# Patient Record
Sex: Female | Born: 1935 | Race: White | Hispanic: No | Smoking: Former smoker
Health system: Southern US, Community
[De-identification: ages and names within clinical notes are randomized; demographics above are authoritative.]

## PROBLEM LIST (undated history)

## (undated) DIAGNOSIS — I10 Essential (primary) hypertension: Secondary | ICD-10-CM

## (undated) DIAGNOSIS — R600 Localized edema: Secondary | ICD-10-CM

## (undated) DIAGNOSIS — IMO0002 Reserved for concepts with insufficient information to code with codable children: Secondary | ICD-10-CM

## (undated) DIAGNOSIS — I480 Paroxysmal atrial fibrillation: Secondary | ICD-10-CM

## (undated) DIAGNOSIS — E785 Hyperlipidemia, unspecified: Secondary | ICD-10-CM

## (undated) DIAGNOSIS — I2510 Atherosclerotic heart disease of native coronary artery without angina pectoris: Secondary | ICD-10-CM

## (undated) DIAGNOSIS — I2699 Other pulmonary embolism without acute cor pulmonale: Secondary | ICD-10-CM

## (undated) HISTORY — PX: REPLACEMENT TOTAL KNEE: SUR1224

## (undated) HISTORY — DX: Paroxysmal atrial fibrillation: I48.0

## (undated) HISTORY — DX: Essential (primary) hypertension: I10

## (undated) HISTORY — DX: Reserved for concepts with insufficient information to code with codable children: IMO0002

## (undated) HISTORY — PX: NASAL SINUS SURGERY: SHX719

## (undated) HISTORY — DX: Other pulmonary embolism without acute cor pulmonale: I26.99

## (undated) HISTORY — DX: Hyperlipidemia, unspecified: E78.5

## (undated) HISTORY — DX: Localized edema: R60.0

## (undated) HISTORY — DX: Atherosclerotic heart disease of native coronary artery without angina pectoris: I25.10

## (undated) HISTORY — PX: BACK SURGERY: SHX140

---

## 1988-07-18 HISTORY — PX: TOTAL HIP ARTHROPLASTY: SHX124

## 1999-03-24 ENCOUNTER — Other Ambulatory Visit: Admission: RE | Admit: 1999-03-24 | Discharge: 1999-03-24 | Payer: Self-pay | Admitting: Obstetrics and Gynecology

## 2000-05-24 ENCOUNTER — Other Ambulatory Visit: Admission: RE | Admit: 2000-05-24 | Discharge: 2000-05-24 | Payer: Self-pay | Admitting: Obstetrics and Gynecology

## 2003-06-10 ENCOUNTER — Ambulatory Visit (HOSPITAL_COMMUNITY): Admission: RE | Admit: 2003-06-10 | Discharge: 2003-06-10 | Payer: Self-pay | Admitting: Orthopedic Surgery

## 2004-01-15 ENCOUNTER — Encounter: Admission: RE | Admit: 2004-01-15 | Discharge: 2004-01-15 | Payer: Self-pay | Admitting: Family Medicine

## 2004-01-23 ENCOUNTER — Encounter: Admission: RE | Admit: 2004-01-23 | Discharge: 2004-01-23 | Payer: Self-pay | Admitting: Family Medicine

## 2004-04-19 ENCOUNTER — Encounter: Payer: Self-pay | Admitting: Orthopedic Surgery

## 2004-05-18 ENCOUNTER — Encounter: Payer: Self-pay | Admitting: Orthopedic Surgery

## 2004-07-23 ENCOUNTER — Encounter: Admission: RE | Admit: 2004-07-23 | Discharge: 2004-07-23 | Payer: Self-pay | Admitting: Family Medicine

## 2004-08-23 ENCOUNTER — Encounter: Payer: Self-pay | Admitting: Internal Medicine

## 2004-09-15 ENCOUNTER — Encounter: Payer: Self-pay | Admitting: Internal Medicine

## 2004-09-23 ENCOUNTER — Emergency Department: Payer: Self-pay | Admitting: Emergency Medicine

## 2004-09-23 ENCOUNTER — Other Ambulatory Visit: Payer: Self-pay

## 2004-10-04 ENCOUNTER — Ambulatory Visit: Payer: Self-pay | Admitting: Family Medicine

## 2004-10-11 ENCOUNTER — Ambulatory Visit: Payer: Self-pay | Admitting: Family Medicine

## 2004-10-18 ENCOUNTER — Ambulatory Visit: Payer: Self-pay | Admitting: Family Medicine

## 2004-10-26 ENCOUNTER — Ambulatory Visit: Payer: Self-pay | Admitting: Family Medicine

## 2004-11-04 ENCOUNTER — Ambulatory Visit: Payer: Self-pay | Admitting: Family Medicine

## 2004-11-10 ENCOUNTER — Ambulatory Visit: Payer: Self-pay | Admitting: Family Medicine

## 2005-03-30 ENCOUNTER — Encounter: Payer: Self-pay | Admitting: Neurosurgery

## 2005-04-01 ENCOUNTER — Encounter: Admission: RE | Admit: 2005-04-01 | Discharge: 2005-04-01 | Payer: Self-pay | Admitting: Family Medicine

## 2005-04-17 ENCOUNTER — Encounter: Payer: Self-pay | Admitting: Neurosurgery

## 2005-05-16 ENCOUNTER — Ambulatory Visit: Payer: Self-pay | Admitting: Internal Medicine

## 2005-05-17 ENCOUNTER — Ambulatory Visit: Payer: Self-pay | Admitting: Cardiology

## 2005-05-18 ENCOUNTER — Encounter: Payer: Self-pay | Admitting: Neurosurgery

## 2005-06-17 ENCOUNTER — Encounter: Payer: Self-pay | Admitting: Neurosurgery

## 2005-07-18 ENCOUNTER — Encounter: Payer: Self-pay | Admitting: Neurosurgery

## 2005-08-02 ENCOUNTER — Ambulatory Visit: Payer: Self-pay | Admitting: Internal Medicine

## 2005-08-18 ENCOUNTER — Encounter: Payer: Self-pay | Admitting: Neurosurgery

## 2005-09-15 ENCOUNTER — Encounter: Payer: Self-pay | Admitting: Neurosurgery

## 2005-09-22 ENCOUNTER — Ambulatory Visit: Payer: Self-pay | Admitting: Family Medicine

## 2005-09-26 ENCOUNTER — Ambulatory Visit: Payer: Self-pay | Admitting: Ophthalmology

## 2005-10-03 ENCOUNTER — Ambulatory Visit: Payer: Self-pay | Admitting: Ophthalmology

## 2005-10-16 ENCOUNTER — Encounter: Payer: Self-pay | Admitting: Neurosurgery

## 2005-11-15 ENCOUNTER — Encounter: Admission: RE | Admit: 2005-11-15 | Discharge: 2005-11-15 | Payer: Self-pay | Admitting: Family Medicine

## 2005-11-15 ENCOUNTER — Encounter: Payer: Self-pay | Admitting: Neurosurgery

## 2005-12-16 ENCOUNTER — Encounter: Payer: Self-pay | Admitting: Neurosurgery

## 2005-12-20 ENCOUNTER — Ambulatory Visit: Payer: Self-pay | Admitting: Ophthalmology

## 2005-12-26 ENCOUNTER — Ambulatory Visit: Payer: Self-pay | Admitting: Ophthalmology

## 2006-01-25 ENCOUNTER — Encounter: Payer: Self-pay | Admitting: Orthopedic Surgery

## 2006-02-15 ENCOUNTER — Encounter: Payer: Self-pay | Admitting: Orthopedic Surgery

## 2006-03-18 ENCOUNTER — Encounter: Payer: Self-pay | Admitting: Orthopedic Surgery

## 2006-03-22 ENCOUNTER — Other Ambulatory Visit: Payer: Self-pay

## 2006-03-22 ENCOUNTER — Emergency Department: Payer: Self-pay | Admitting: Emergency Medicine

## 2006-04-10 ENCOUNTER — Ambulatory Visit: Payer: Self-pay | Admitting: Cardiology

## 2006-04-18 ENCOUNTER — Ambulatory Visit: Payer: Self-pay

## 2006-04-18 ENCOUNTER — Ambulatory Visit: Payer: Self-pay | Admitting: Cardiology

## 2006-04-18 ENCOUNTER — Encounter: Payer: Self-pay | Admitting: Cardiovascular Disease

## 2006-04-28 ENCOUNTER — Inpatient Hospital Stay (HOSPITAL_BASED_OUTPATIENT_CLINIC_OR_DEPARTMENT_OTHER): Admission: RE | Admit: 2006-04-28 | Discharge: 2006-04-28 | Payer: Self-pay | Admitting: Cardiology

## 2006-04-28 ENCOUNTER — Ambulatory Visit: Payer: Self-pay | Admitting: Cardiology

## 2006-05-12 ENCOUNTER — Ambulatory Visit: Payer: Self-pay | Admitting: Cardiology

## 2006-05-30 ENCOUNTER — Encounter: Admission: RE | Admit: 2006-05-30 | Discharge: 2006-05-30 | Payer: Self-pay | Admitting: Family Medicine

## 2006-06-30 ENCOUNTER — Ambulatory Visit: Payer: Self-pay

## 2006-06-30 ENCOUNTER — Ambulatory Visit: Payer: Self-pay | Admitting: Cardiology

## 2006-07-31 ENCOUNTER — Ambulatory Visit: Payer: Self-pay | Admitting: Cardiology

## 2006-08-23 ENCOUNTER — Inpatient Hospital Stay (HOSPITAL_COMMUNITY): Admission: RE | Admit: 2006-08-23 | Discharge: 2006-08-29 | Payer: Self-pay | Admitting: Orthopedic Surgery

## 2006-09-20 ENCOUNTER — Encounter: Payer: Self-pay | Admitting: Orthopedic Surgery

## 2006-09-28 ENCOUNTER — Ambulatory Visit: Payer: Self-pay | Admitting: Cardiology

## 2006-10-17 ENCOUNTER — Encounter: Payer: Self-pay | Admitting: Orthopedic Surgery

## 2006-11-16 ENCOUNTER — Encounter: Payer: Self-pay | Admitting: Orthopedic Surgery

## 2007-02-01 ENCOUNTER — Encounter: Payer: Self-pay | Admitting: Orthopedic Surgery

## 2007-02-02 ENCOUNTER — Ambulatory Visit: Payer: Self-pay | Admitting: Specialist

## 2007-03-03 IMAGING — US ABDOMEN ULTRASOUND
1 series · 14 of 25 positions shown · non-contrast
Comparison: none

REASON FOR EXAM: Evaluate cholecystitis                       RM2
COMMENTS:

[Series 1: abdomen ultrasound · 0.35mm/px · 14 of 50 slices shown]
[im 1/50]
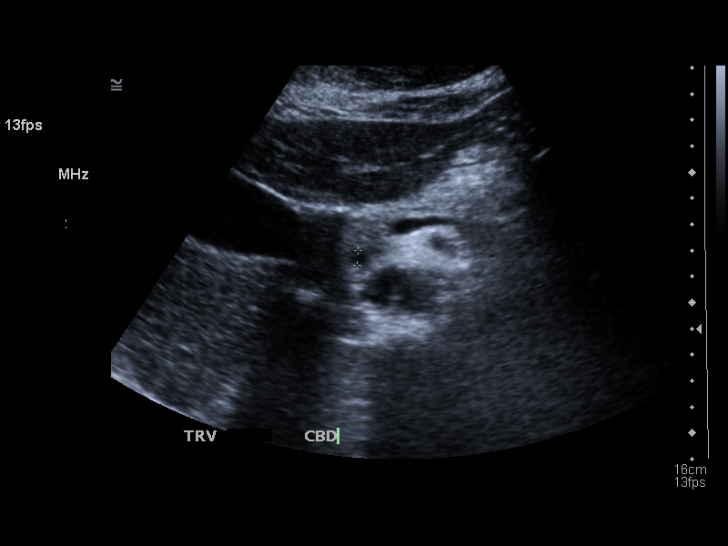
[im 5/50]
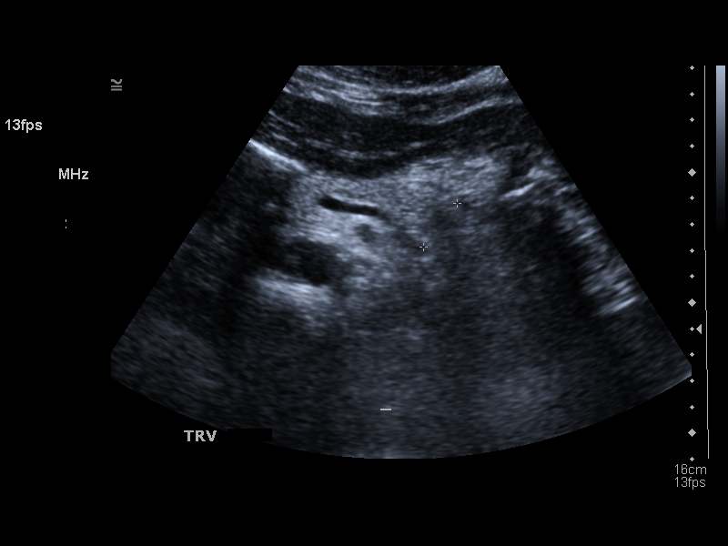
[im 9/50]
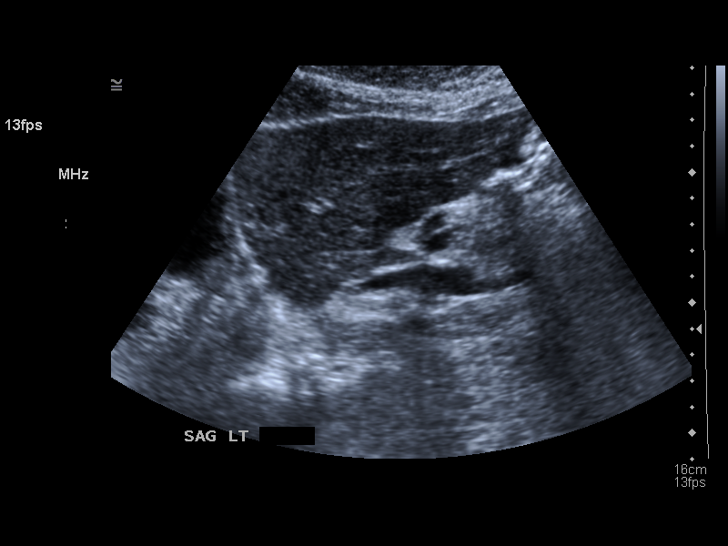
[im 13/50]
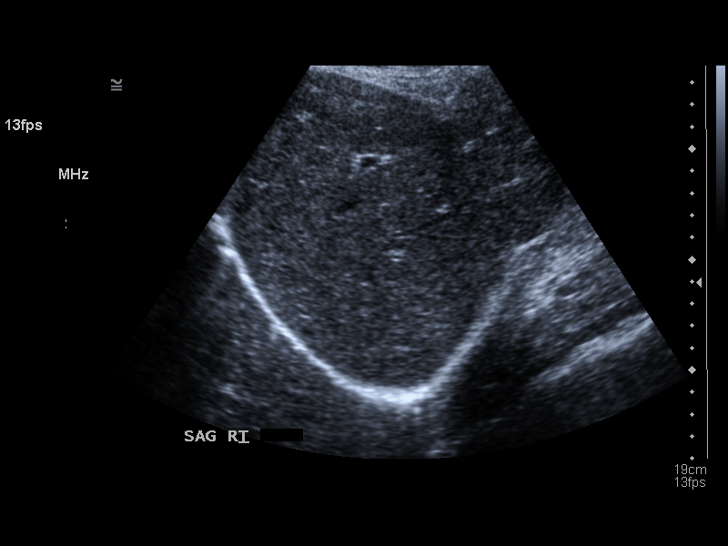
[im 17/50]
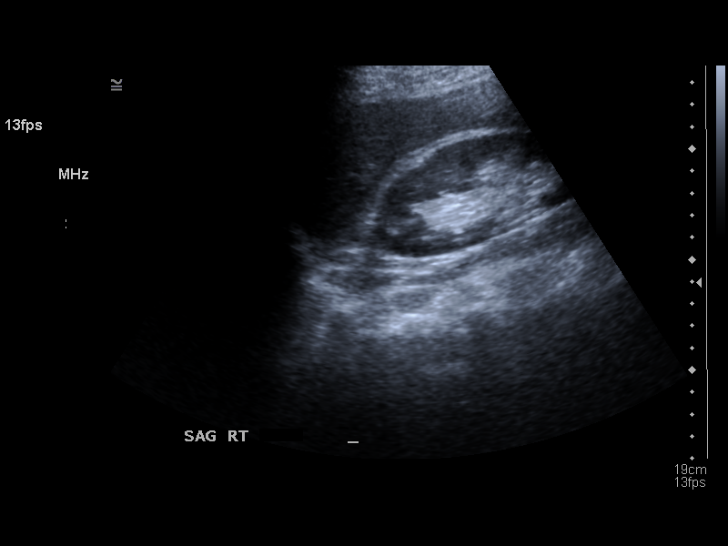
[im 19/50]
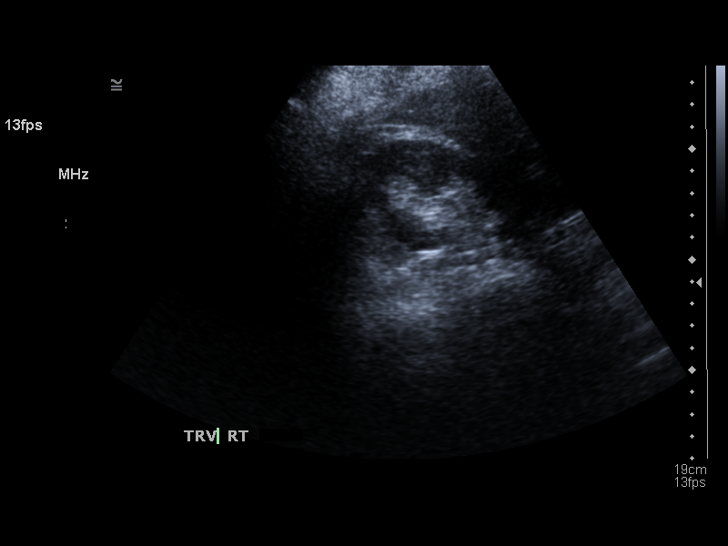
[im 23/50]
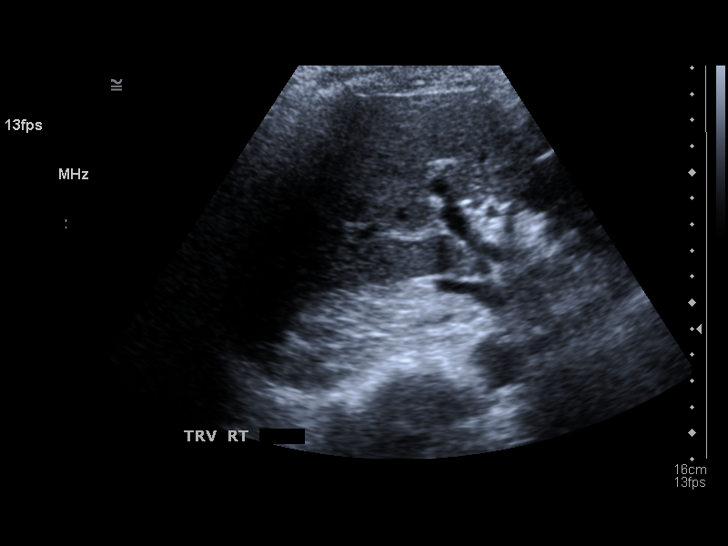
[im 27/50]
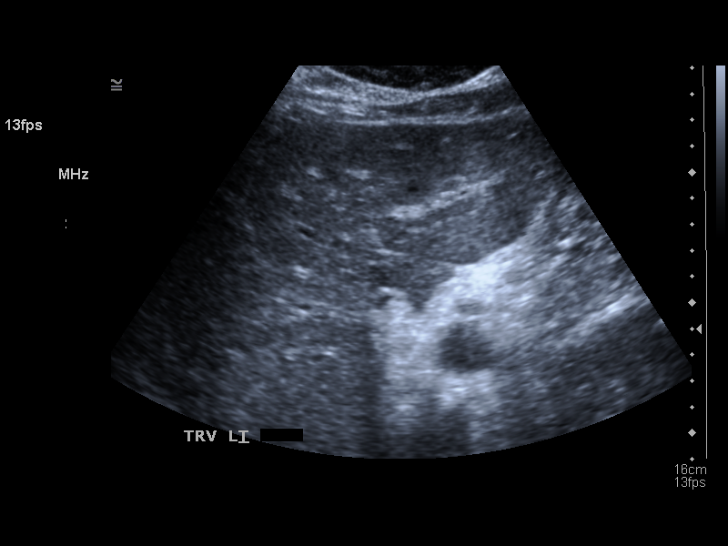
[im 31/50]
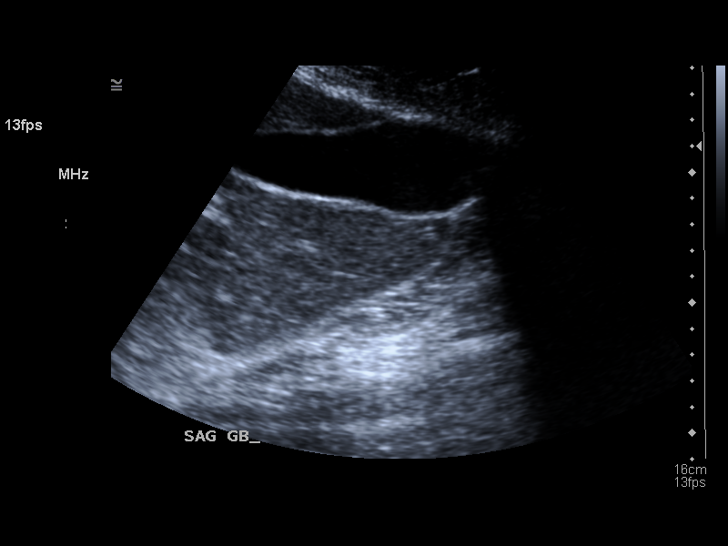
[im 33/50]
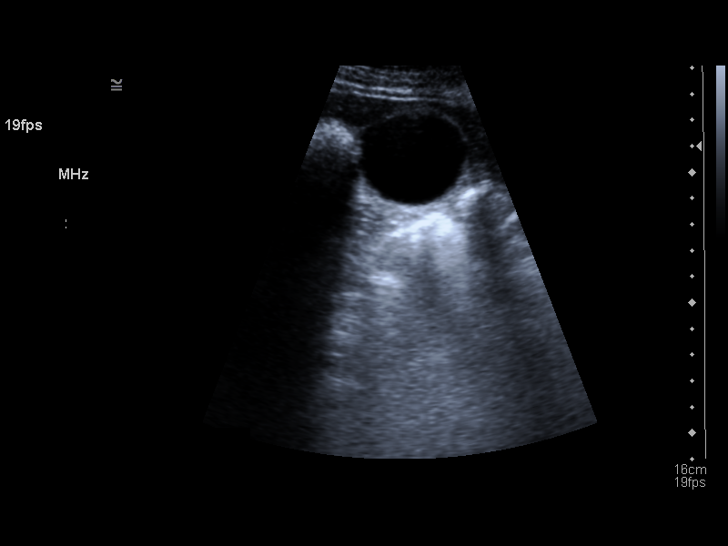
[im 37/50]
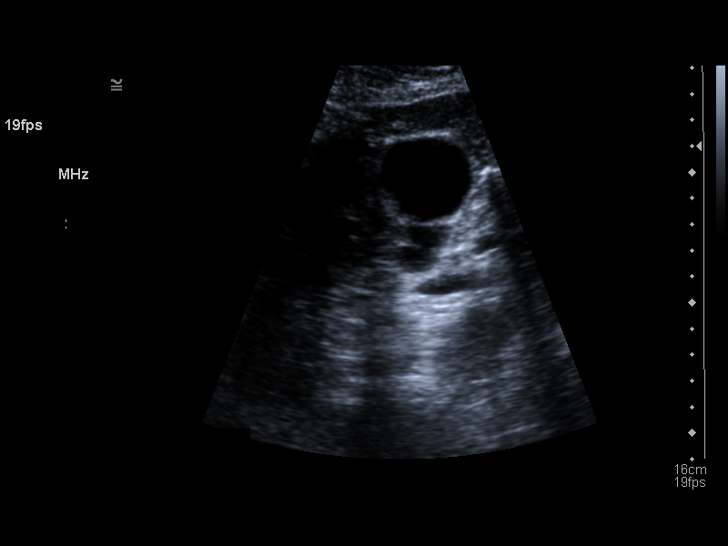
[im 41/50]
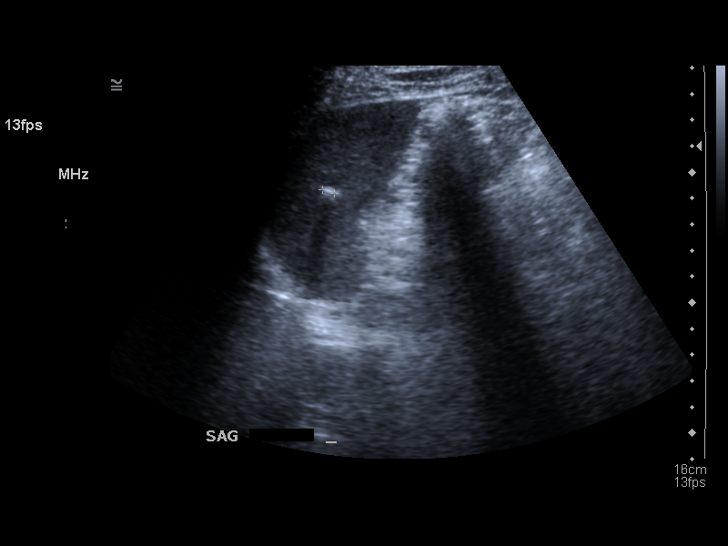
[im 45/50]
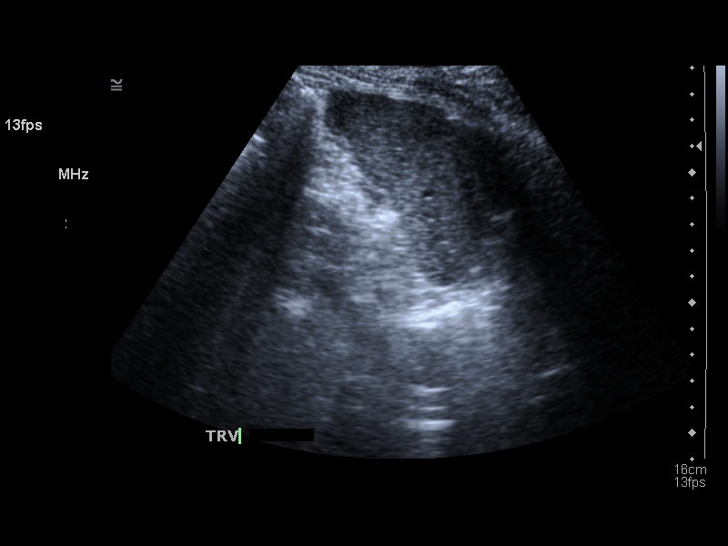
[im 50/50]
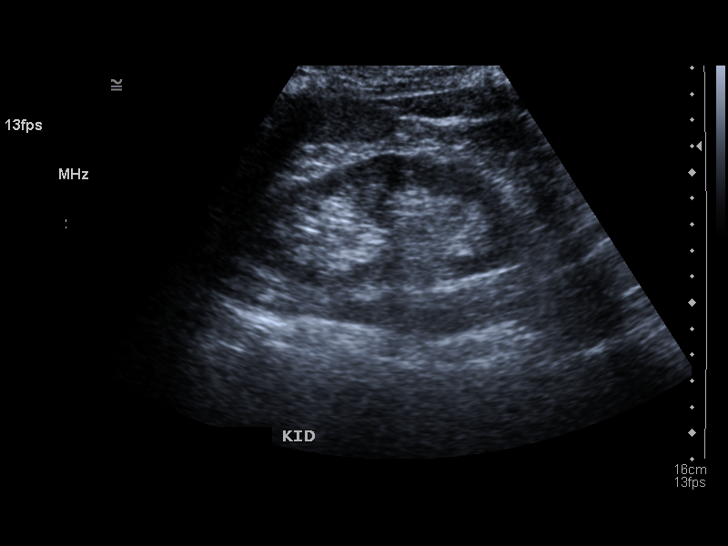

[14 of 25 positions shown; findings below may reference images not displayed]

PROCEDURE:     US  - US ABDOMEN GENERAL SURVEY  - September 23, 2004  [DATE]

RESULT:     The liver exhibits normal echotexture with no mass or ductal
dilation. The gallbladder is adequately distended with no evidence of
stones, wall thickening or pericholecystic fluid. The common bile duct is
normal measuring 5.6 mm in diameter. The pancreas exhibits no focal mass or
ductal dilation. The spleen is normal in size but there is a calcification
with distal shadowing present consistent with prior granulomatous infection.
The kidneys are normal in echotexture and size with no evidence of
obstruction.
IMPRESSION: I see no acute intra-abdominal abnormality. Specifically, I
see no abnormality related to the liver or gallbladder.

## 2007-08-01 ENCOUNTER — Ambulatory Visit: Payer: Self-pay | Admitting: Internal Medicine

## 2007-08-15 ENCOUNTER — Ambulatory Visit: Payer: Self-pay | Admitting: Internal Medicine

## 2007-08-15 ENCOUNTER — Encounter: Payer: Self-pay | Admitting: Internal Medicine

## 2008-01-16 ENCOUNTER — Ambulatory Visit (HOSPITAL_COMMUNITY): Admission: RE | Admit: 2008-01-16 | Discharge: 2008-01-16 | Payer: Self-pay | Admitting: Neurological Surgery

## 2009-02-02 ENCOUNTER — Ambulatory Visit: Payer: Self-pay | Admitting: Cardiovascular Disease

## 2009-02-02 DIAGNOSIS — R609 Edema, unspecified: Secondary | ICD-10-CM

## 2009-02-02 DIAGNOSIS — I4891 Unspecified atrial fibrillation: Secondary | ICD-10-CM

## 2009-02-02 DIAGNOSIS — I10 Essential (primary) hypertension: Secondary | ICD-10-CM

## 2009-02-02 DIAGNOSIS — I2510 Atherosclerotic heart disease of native coronary artery without angina pectoris: Secondary | ICD-10-CM

## 2009-02-11 LAB — CONVERTED CEMR LAB
BUN: 23 mg/dL (ref 6–23)
CO2: 27 meq/L (ref 19–32)
Calcium: 9.6 mg/dL (ref 8.4–10.5)
Chloride: 104 meq/L (ref 96–112)
Creatinine, Ser: 0.69 mg/dL (ref 0.40–1.20)
Glucose, Bld: 84 mg/dL (ref 70–99)
HCT: 38.4 % (ref 36.0–46.0)
Hemoglobin: 11.7 g/dL — ABNORMAL LOW (ref 12.0–15.0)
MCHC: 30.5 g/dL (ref 30.0–36.0)
MCV: 97.5 fL (ref 78.0–100.0)
Platelets: 235 10*3/uL (ref 150–400)
Potassium: 4.3 meq/L (ref 3.5–5.3)
RBC: 3.94 M/uL (ref 3.87–5.11)
RDW: 14.7 % (ref 11.5–15.5)
Sodium: 142 meq/L (ref 135–145)
TSH: 1.97 microintl units/mL (ref 0.350–4.500)
WBC: 5.1 10*3/uL (ref 4.0–10.5)

## 2009-03-31 ENCOUNTER — Ambulatory Visit: Payer: Self-pay

## 2009-03-31 ENCOUNTER — Encounter: Payer: Self-pay | Admitting: Cardiovascular Disease

## 2009-04-01 ENCOUNTER — Ambulatory Visit: Payer: Self-pay | Admitting: Cardiovascular Disease

## 2009-04-27 ENCOUNTER — Telehealth (INDEPENDENT_AMBULATORY_CARE_PROVIDER_SITE_OTHER): Payer: Self-pay | Admitting: *Deleted

## 2009-05-06 ENCOUNTER — Telehealth (INDEPENDENT_AMBULATORY_CARE_PROVIDER_SITE_OTHER): Payer: Self-pay | Admitting: *Deleted

## 2009-10-05 ENCOUNTER — Telehealth: Payer: Self-pay | Admitting: Cardiovascular Disease

## 2009-10-07 ENCOUNTER — Encounter: Payer: Self-pay | Admitting: Cardiovascular Disease

## 2009-10-08 ENCOUNTER — Ambulatory Visit: Payer: Self-pay

## 2009-10-08 ENCOUNTER — Encounter: Payer: Self-pay | Admitting: Cardiovascular Disease

## 2009-10-13 ENCOUNTER — Ambulatory Visit: Payer: Self-pay | Admitting: Cardiovascular Disease

## 2009-10-13 DIAGNOSIS — R079 Chest pain, unspecified: Secondary | ICD-10-CM

## 2010-05-17 ENCOUNTER — Encounter: Payer: Self-pay | Admitting: Cardiovascular Disease

## 2010-05-17 ENCOUNTER — Emergency Department: Payer: Self-pay | Admitting: Emergency Medicine

## 2010-05-23 ENCOUNTER — Encounter: Payer: Self-pay | Admitting: Cardiovascular Disease

## 2010-05-23 ENCOUNTER — Inpatient Hospital Stay: Payer: Self-pay | Admitting: Internal Medicine

## 2010-05-24 ENCOUNTER — Encounter: Payer: Self-pay | Admitting: Cardiovascular Disease

## 2010-06-25 ENCOUNTER — Ambulatory Visit: Payer: Self-pay | Admitting: Cardiovascular Disease

## 2010-07-15 ENCOUNTER — Ambulatory Visit: Payer: Self-pay | Admitting: Family

## 2010-08-08 ENCOUNTER — Encounter: Payer: Self-pay | Admitting: Family Medicine

## 2010-08-08 ENCOUNTER — Encounter: Payer: Self-pay | Admitting: Neurological Surgery

## 2010-08-17 NOTE — Progress Notes (Signed)
Summary: set up doppler   Phone Note Call from Patient Call back at Home Phone [phone] Call back at home after 4 p.m. today.    Caller: Patient  Reason for Call: Talk to Nurse Details for Reason: Per pt calling to set up doppler. pt hubsand was sick the past - why she had to cancel appt,  schedule is not until april 12. wanted sooner is [place].  Initial call taken by: Lorne Skeens,  October 05, 2009 1:04 PM  Follow-up for Phone Call        Pt. needs lower extremity venous dopplers. Have tried to reach pt 5 times since 4 PM today but phone is busy. Will continue to try to reach her. Dossie Arbour, RN, BSN  October 05, 2009 4:43 PM Spoke with pt. She will come to [facility] office on October 08, 2009 at 1:00 for dopplers. Follow-up by: Dossie Arbour, RN, BSN,  October 05, 2009 5:00 PM

## 2010-08-17 NOTE — Miscellaneous (Signed)
Summary: Orders Update  Clinical Lists Changes  Orders: Added new Test order of Venous Duplex Lower Extremity (Venous Duplex Lower) - Signed 

## 2010-08-17 NOTE — Assessment & Plan Note (Signed)
Summary: ROV/AMD  Medications Added FUROSEMIDE 40 MG TABS (FUROSEMIDE) Take one tablet by mouth twice a week - not taking POTASSIUM CHLORIDE CRYS CR 20 MEQ CR-TABS (POTASSIUM CHLORIDE CRYS CR) Take one tablet by mouth twice a week - not taking ZOLOFT 50 MG TABS (SERTRALINE HCL) 1 by mouth once daily - not sure of dose LISINOPRIL 10 MG TABS (LISINOPRIL) Take one tablet by mouth daily NITROSTAT 0.4 MG SUBL (NITROGLYCERIN) 1 tablet under tongue at onset of chest pain; you may repeat every 5 minutes for up to 3 doses.      Allergies Added:   Primary Provider:  Jerl Mina, MD   History of Present Illness:  77 yo WF with history of mild non-obstructive CAD, HTN, Hyperlipidemia, paroxysmal atrial fibrillation, PE, asthma and chronic venous insufficiency who presents for follow up.   She has been caring for her sick husband and has been under much stress. She c/o chronic bilateral lower ext edema, slightly worse on the right than left.   She has had chronic back pain for many years and along with lower extremity joint pain, this keeps her inactive. She has had no exertional chest pain.  she cannot wear compression stockings because she cannot get them on. She refuses to take Lasix daily because it makes her run to the bathroom. She has not been elevating her feet during the day.   She had a new episode of chest pain three days ago after she ate dinner with her husband. She went home and her symptoms resolved without intervention. She has not had any other epsiodes since that time. She had previous episode 20 years ago.     Current Problems (verified): 1)  Essential Hypertension, Benign  (ICD-401.1) 2)  Cad, Native Vessel  (ICD-414.01) 3)  Edema  (ICD-782.3) 4)  Coronary Atherosclerosis Native Coronary Artery  (ICD-414.01) 5)  Atrial Fibrillation  (ICD-427.31)  Current Medications (verified): 1)  Atenolol 25 Mg Tabs (Atenolol) .... Take One Tablet By Mouth Two Times A Day 2)   Simvastatin 40 Mg Tabs (Simvastatin) .... Take One Tablet By Mouth Daily At Bedtime 3)  Warfarin Sodium 5 Mg Tabs (Warfarin Sodium) .... Use As Directed By Anticoagulation Clinic 4)  Amlodipine Besylate 5 Mg Tabs (Amlodipine Besylate) .... Take One Tablet By Mouth Daily 5)  Furosemide 40 Mg Tabs (Furosemide) .... Take One Tablet By Mouth Twice A Week - Not Taking 6)  Potassium Chloride Crys Cr 20 Meq Cr-Tabs (Potassium Chloride Crys Cr) .... Take One Tablet By Mouth Twice A Week - Not Taking 7)  Zoloft 50 Mg Tabs (Sertraline Hcl) .Marland Kitchen.. 1 By Mouth Once Daily - Not Sure of Dose  Allergies (verified): 1)  ! Levaquin 2)  ! * Voltram 3)  ! Sulfa 4)  ! Morphine 5)  ! Oxycodone Hcl 6)  ! Darvocet 7)  ! * Robaxin 8)  ! * Fentanyl 9)  ! * Shellfish  Past History:  Past Medical History: Last updated: 04/01/2009 CAD-mild nonobstructive by cath HTN Hyperlipidemia Pulmonary embolism Paroxysmal atrial fibrillation Asthma Degenerative disc disease Lower extremity edema-chronic  Past Surgical History: Last updated: 27-Feb-2009 Back surgeries x 4 Left Hip replacement 1990 Right knee replacement Sinus surgery  Family History: Last updated: 02-27-2009 Mother deceased age 49 Alzheimers Father deceased [place] Strong family history of CAD  Social History: Last updated: February 27, 2009 No tobacco, remote history, quit 1976 No alcohol No illicit drugs Married Husband has Parkinsons 2 children  Review of Systems  The patient complains of chest pain, dyspnea on exertion, and peripheral edema.  The patient denies fever, weight loss, weight gain, vision loss, decreased hearing, hoarseness, syncope, prolonged cough, abdominal pain, incontinence, muscle weakness, depression, and enlarged lymph nodes.    Vital Signs:  Patient profile:   77 year old female Height:      64 inches Weight:      201.75 pounds BMI:     34.76 Pulse rate:   63 / minute Pulse rhythm:   regular BP sitting:   144 /  81  (left arm) Cuff size:   large  Vitals Entered By: Mercer Pod (October 13, 2009 4:19 PM)    EKG  Procedure date:  10/13/2009  Findings:      Normal Sinus rhythm with rate of 63 bpm, RBBB, LAFB  Impression & Recommendations:  Problem # 1:  CHEST PAIN-UNSPECIFIED (ICD-786.50) Recent signs concerning for atypical chest pain, possibly GERD or indigestion. I have asked her to contact us Me if she continues to have further episodes of chest pain.  Her updated medication list for this problem includes:    Atenolol 25 Mg Tabs (Atenolol) .Marland Kitchen... Take one tablet by mouth two times a day    Warfarin Sodium 5 Mg Tabs (Warfarin sodium) ..... Use as directed by anticoagulation clinic    Amlodipine Besylate 5 Mg Tabs (Amlodipine besylate) .Marland Kitchen... Take one tablet by mouth daily    Lisinopril 10 Mg Tabs (Lisinopril) .Marland Kitchen... Take one tablet by mouth daily    Nitrostat 0.4 Mg Subl (Nitroglycerin) .Marland Kitchen... 1 tablet under tongue at onset of chest pain; you may repeat every 5 minutes for up to 3 doses.  Problem # 2:  EDEMA (ICD-782.3)  She has chronic venous stasis. Echocardiogram shows no elevated right ventricular systolic pressures. She's unable to place TED hose. We will hold her amlodipine and sometimes this can make venous insufficiency worse and start her on lisinopril 10 mg daily.  Problem # 3:  CAD, NATIVE VESSEL (ICD-414.01) mild nonobstructive disease based on cardiac catheterization in 2007. We'll continue her on simvastatin. She is not on aspirin as she is on warfarin. She is a nonsmoker.  Her updated medication list for this problem includes:    Atenolol 25 Mg Tabs (Atenolol) .Marland Kitchen... Take one tablet by mouth two times a day    Warfarin Sodium 5 Mg Tabs (Warfarin sodium) ..... Use as directed by anticoagulation clinic    Amlodipine Besylate 5 Mg Tabs (Amlodipine besylate) .Marland Kitchen... Take one tablet by mouth daily    Lisinopril 10 Mg Tabs (Lisinopril) .Marland Kitchen... Take one tablet by mouth daily     Nitrostat 0.4 Mg Subl (Nitroglycerin) .Marland Kitchen... 1 tablet under tongue at onset of chest pain; you may repeat every 5 minutes for up to 3 doses. Prescriptions: NITROSTAT 0.4 MG SUBL (NITROGLYCERIN) 1 tablet under tongue at onset of chest pain; you may repeat every 5 minutes for up to 3 doses.  #25 x 3   Entered by:   Charlena Cross, RN, BSN   Authorized by:   Dossie Arbour MD   Signed by:   Charlena Cross, RN, BSN on 10/13/2009   Method used:   Electronically to        [facility]* (retail)       [address]       [place], [place]  [address]       Ph: [phone]       Fax: [phone]  RxID:   [id number] LISINOPRIL 10 MG TABS (LISINOPRIL) Take one tablet by mouth daily  #30 x 3   Entered by:   Charlena Cross, RN, BSN   Authorized by:   Dossie Arbour MD   Signed by:   Charlena Cross, RN, BSN on 10/13/2009   Method used:   Electronically to        [facility]* (retail)       [address]       [place], [place]  [address]       Ph: [phone]       Fax: [phone]   RxID:   [id number]   Appended Document: ROV/AMD physical examination: Well-appearing elderly woman in no apparent distress, HEENT exam is benign, oropharynx is clear, neck is supple with no JVP or carotid bruits, heart sounds are regular with S1-S2 and no murmurs appreciated, lungs are clear to auscultation with no wheezes or rales, abdominal exam is benign, no significant tenderness on palpation. She does have trace to 1+ lower extremity edema bilaterally to below the knees, pulses are equal and symmetrical in her upper and lower extremities. Neurologic exam is nonfocal, skin is warm and dry

## 2010-10-12 ENCOUNTER — Encounter: Payer: Self-pay | Admitting: Family Medicine

## 2010-10-17 ENCOUNTER — Encounter: Payer: Self-pay | Admitting: Family Medicine

## 2010-10-21 ENCOUNTER — Telehealth: Payer: Self-pay | Admitting: *Deleted

## 2010-10-21 NOTE — Telephone Encounter (Signed)
Pt called this am c/o incr RLE edema and pain recently and is concerned could be a blood clot. Pt does not have pain at this time, however, RLE is more swollen than left. Pt does state this is not really unusual for her. Pt has h/o LE neruopathy and edema. Per last office note, we had tried Lasix 40mg  2x weekly in the past, but due to pt's neuropathy (bladder) pt unable to tolerate urinary frequency. Pt also has h/o of a fib and PE, pt is currently on Coumadin 3mg  qd except 4.5mg  on Wed. This is followed at pcp (Dr. Burnett Sheng). Pt does not know what her last INR was, states "if they want me to change my dose they call me after lab results come back." Pt is not very satisfied with coumadin visits, she would rather have POC testing in office and know results same day. I have requested last lab/office note from Dr. Webb Silversmith and set pt up for coumadin visit next wed to establish here in [place]. But pt does think her INR's have been stable though.  Pt does state she was on her feet more than usual yesterday. Advised pt to take Lasix 40mg  today only and see if this helps with swelling, and to call office to let us know of status. Notified her to call our office if any c/o incr pain prior to appt. Otherwise, pt will f/u with Dr. Mariah Milling and have coumadin visit next [facility] 10/27/10. Pt ok with this.

## 2010-10-27 ENCOUNTER — Ambulatory Visit: Payer: Self-pay | Admitting: Cardiovascular Disease

## 2010-10-27 ENCOUNTER — Ambulatory Visit (INDEPENDENT_AMBULATORY_CARE_PROVIDER_SITE_OTHER): Payer: Medicare Other | Admitting: Emergency Medicine

## 2010-10-27 ENCOUNTER — Encounter: Payer: Self-pay | Admitting: Cardiovascular Disease

## 2010-10-27 ENCOUNTER — Ambulatory Visit (INDEPENDENT_AMBULATORY_CARE_PROVIDER_SITE_OTHER): Payer: Medicare Other | Admitting: Cardiovascular Disease

## 2010-10-27 DIAGNOSIS — I4891 Unspecified atrial fibrillation: Secondary | ICD-10-CM

## 2010-10-27 DIAGNOSIS — I2510 Atherosclerotic heart disease of native coronary artery without angina pectoris: Secondary | ICD-10-CM

## 2010-10-27 DIAGNOSIS — R609 Edema, unspecified: Secondary | ICD-10-CM

## 2010-10-27 DIAGNOSIS — R0600 Dyspnea, unspecified: Secondary | ICD-10-CM | POA: Insufficient documentation

## 2010-10-27 DIAGNOSIS — Z7901 Long term (current) use of anticoagulants: Secondary | ICD-10-CM | POA: Insufficient documentation

## 2010-10-27 DIAGNOSIS — I10 Essential (primary) hypertension: Secondary | ICD-10-CM

## 2010-10-27 DIAGNOSIS — R0989 Other specified symptoms and signs involving the circulatory and respiratory systems: Secondary | ICD-10-CM

## 2010-10-27 LAB — POCT INR: INR: 1.3

## 2010-10-27 NOTE — Patient Instructions (Signed)
You are doing well. No medication changes were made. Please take lasix as needed for worsening swelling or for increasing shortness of breath. Please call us if you have new issues that need to be addressed before your next appt.  We will call you for a follow up Appt.

## 2010-10-27 NOTE — Assessment & Plan Note (Signed)
Her edema is likely secondary to venous insufficiency, worse on the right side from her history of right knee surgery. We have suggested that she try TED hose, leg elevation. She is unable to get a TED hose on herself. She can also try diuretic on an occasional basis if the swelling is severe.

## 2010-10-27 NOTE — Assessment & Plan Note (Signed)
We have suggested she continue her statin. Those symptoms concerning for angina at this time. No further workup.

## 2010-10-27 NOTE — Assessment & Plan Note (Signed)
Blood pressure is well-controlled on her current medication regimen. No changes made. 

## 2010-10-27 NOTE — Progress Notes (Signed)
   Patient ID: [name], female    DOB: 25-May-1929, 77 y.o.   MRN: [id number]  HPI Comments: 77 yo WF with history of mild non-obstructive CAD, HTN, Hyperlipidemia, paroxysmal atrial fibrillation, PE, asthma and chronic venous insufficiency who presents for follow up.   Her husband passed away in 08-17-22 of this year.  She had a hospitalization in November of last year for shortness of breath felt secondary to bronchitis and diastolic dysfunction. Echocardiogram showed normal function with mild MR, diastolic dysfunction, normal right ventricular systolic pressures.  She reports having significant problems with her legs. She has swelling worse on the right than the left, neuropathy, chronic pain, severe left knee and left hip pain. She has followup with orthopedics.  She has had chronic back pain for many years and along with lower extremity joint pain, this keeps her inactive. She has had no exertional chest pain.   she cannot wear compression stockings because she cannot get them on. She does have shortness of breath with exertion but has not been taking Lasix.   EKG shows normal sinus rhythm with rate 63 beats per minute, right bundle branch block, left anterior fascicular block      Review of Systems  HENT: Negative.   Eyes: Negative.   Respiratory: Positive for shortness of breath.   Cardiovascular: Positive for palpitations and leg swelling.  Gastrointestinal: Negative.   Musculoskeletal: Positive for back pain, joint swelling, arthralgias and gait problem.  Skin: Negative.   Neurological: Negative.   Hematological: Negative.   Psychiatric/Behavioral: Negative.   All other systems reviewed and are negative.   BP 130/62  Pulse 63  Ht 5\' 4"  (1.626 m)  Wt 178 lb (80.74 kg)  BMI 30.55 kg/m2  Physical Exam  Nursing note and vitals reviewed. Constitutional: She is oriented to person, place, and time. She appears well-developed and well-nourished.       Deconditioned, walks  with a walker, slow gait  HENT:  Head: Normocephalic.  Nose: Nose normal.  Mouth/Throat: Oropharynx is clear and moist.  Eyes: Conjunctivae are normal. Pupils are equal, round, and reactive to light.  Neck: Normal range of motion. Neck supple. No JVD present.  Cardiovascular: Normal rate, regular rhythm, normal heart sounds and intact distal pulses.  Exam reveals no gallop and no friction rub.   No murmur heard. Pulmonary/Chest: Effort normal and breath sounds normal. No respiratory distress. She has no wheezes. She has no rales. She exhibits no tenderness.  Abdominal: Soft. Bowel sounds are normal. She exhibits no distension. There is no tenderness.  Musculoskeletal: Normal range of motion. She exhibits edema. She exhibits no tenderness.  Lymphadenopathy:    She has no cervical adenopathy.  Neurological: She is alert and oriented to person, place, and time. Coordination normal.  Skin: Skin is warm and dry. No rash noted. No erythema.  Psychiatric: She has a normal mood and affect. Her behavior is normal. Judgment and thought content normal.         Assessment and Plan

## 2010-10-27 NOTE — Assessment & Plan Note (Signed)
Rhythm today is sinus with no symptoms of tachypalpitations concerning for atrial fibrillation. No further medication changes.

## 2010-10-27 NOTE — Assessment & Plan Note (Signed)
Her shortness of breath is likely multifactorial. She does have diastolic dysfunction, is very deconditioned, mild obesity.  We have suggested that she try Lasix p.r.n. If her breathing gets worse. She does eat frozen foods with high sodium. We have told her to watch her weight and take Lasix as needed for weight increases more than 3 pounds.

## 2010-11-04 ENCOUNTER — Encounter: Payer: Self-pay | Admitting: Cardiovascular Disease

## 2010-11-10 ENCOUNTER — Ambulatory Visit (INDEPENDENT_AMBULATORY_CARE_PROVIDER_SITE_OTHER): Payer: Medicare Other | Admitting: Emergency Medicine

## 2010-11-10 DIAGNOSIS — Z7901 Long term (current) use of anticoagulants: Secondary | ICD-10-CM

## 2010-11-10 DIAGNOSIS — I4891 Unspecified atrial fibrillation: Secondary | ICD-10-CM

## 2010-11-10 LAB — POCT INR: INR: 2.4

## 2010-11-23 ENCOUNTER — Other Ambulatory Visit: Payer: Self-pay | Admitting: Cardiovascular Disease

## 2010-11-24 ENCOUNTER — Other Ambulatory Visit: Payer: Self-pay

## 2010-11-24 MED ORDER — NITROGLYCERIN 0.4 MG SL SUBL: 0.4000 mg | SUBLINGUAL_TABLET | SUBLINGUAL | Status: DC | PRN

## 2010-11-24 NOTE — Telephone Encounter (Signed)
Refill sent in for NTG.

## 2010-11-25 ENCOUNTER — Other Ambulatory Visit (HOSPITAL_COMMUNITY): Payer: Self-pay | Admitting: Orthopedic Surgery

## 2010-11-25 ENCOUNTER — Other Ambulatory Visit: Payer: Self-pay | Admitting: Emergency Medicine

## 2010-11-25 DIAGNOSIS — M25552 Pain in left hip: Secondary | ICD-10-CM

## 2010-11-25 DIAGNOSIS — T84038A Mechanical loosening of other internal prosthetic joint, initial encounter: Secondary | ICD-10-CM

## 2010-11-25 MED ORDER — NITROGLYCERIN 0.4 MG SL SUBL: 0.4000 mg | SUBLINGUAL_TABLET | SUBLINGUAL | Status: AC | PRN

## 2010-11-30 NOTE — Assessment & Plan Note (Signed)
[facility]                         GASTROENTEROLOGY OFFICE NOTE   NAME:[name], [name] CHENETTE                       MRN:          [id number]  DATE:08/01/2007                            DOB:          Feb 04, 1929    PROBLEMS:  Diarrhea and right lower quadrant lower abdominal pain.   HISTORY:  [name] is a pleasant 77 year old white female known to Dr. Lina Sar, who has not been seen by GI for several years.  She had  undergone remote sigmoid colon resection secondary to a stricture, which  was benign.  Her last colonoscopy was in 2003, per Dr. Henrene Hawking.  She has  a somewhat complicated past history, had undergone a spinal fusion  approximately three years ago and has had a difficult course since then  with pain and neuropathy.  She also has a history of hypertension,  hyperlipidemia, asthma, atrial fibrillation.  She had a pulmonary  embolus after surgery in 2006.  Underwent a right knee replacement in  February, 2008.  She reports that she has been having fairly constant  right lower quadrant abdominal pain over the past year.  She says the  pain has been worse over the past month and is generally present daily.  She has new labs that she uses periodically and says that does seem to  help.  Her appetite has  been fair.  She has actually gained weight due  to decrease in her mobility over the past year.  She says that she has  loose, urgent stools post prandially, generally with three bowel  movements per day.  She says she gets a lot of gas and crampy discomfort  because of cramped gas.  She says she is not having any formed stools  over the past couple of months.  She has not noted any melena or  hematochezia.  Has not had any recent antibiotics.  She says that the  urgent stools are affecting her quality of life, as she is afraid to go  out to eat.   CURRENT MEDICATIONS:  1. Atenolol 25 b.i.d.  2. Simvastatin 40 daily.  3. Coumadin as directed.  4.  Celebrex 20 daily.  5. Norvasc 5 mg daily.  6. Prozac 20 daily.  7. Hydrocodone p.r.n.  8. Benadryl p.r.n.   ALLERGIES/INTOLERANCES:  SHELLFISH, LEVAQUIN, DILAUDID, ZINOCEF, SULFA,  MORPHINE, OXYCONTIN, DARVOCET, and ROBAXIN.   PHYSICAL EXAMINATION:  A well-developed, elderly white female in no  acute distress.  Weight is 177, blood pressure 120/60, pulse 64.  CARDIOVASCULAR:  Regular rate and rhythm with S1 and S2.  There is no  murmur, rub or gallop.  PULMONARY:  Clear to A&P.  ABDOMEN:  Large, soft.  Bowel sounds are active.  She is tender in the  right mid quadrant and the right lower quadrant.  There is no guarding  or rebound.  No mass or hepatosplenomegaly.  RECTAL:  Hemoccult negative.  She does have decreased sphincter tone.   IMPRESSION:  A 77 year old female with persistent right lower quadrant  pain and diarrhea.  She carries a diagnosis of irritable bowel syndrome.  The  etiology of her current symptoms is not clear.  Suspect she may have  adhesions and may have increased urgency due to an incompetent anal  sphincter with a history of irritable bowel syndrome, rule out bacterial  overgrowth contributing to her increase in diarrhea and gas recently.   PLAN:  1. Start trial of Imodium 1 p.o. daily.  2. Treat for bacterial overgrowth with Xifaxan 400 mg t.i.d. x10 days      and Align 1 p.o. daily x30 days.  3. Schedule follow-up colonoscopy with Dr. Lina Sar.  Will hold her      Coumadin for five days pre-procedure.   ADDENDUM:  Review of record shows patient had colonoscopy in October,  2001 by a Dr. Dolan Amen at [facility] showing ulcerations at her  anastomotic site from her prior sigmoidectomy.  Biopsies showed acute  and chronic inflammation.  There was a chronic inflammatory infiltration  and some evidence of mild acute cryptitis.  A question of Crohn's was  brought up, so she was placed on a trial of Colazal, and she eventually  discontinued this due to  cost.  She had repeat evaluation in 2002 by Dr.  Henrene Hawking because of complaints of diarrhea.  Upper GI and small bowel  follow-through was done, which was unremarkable.  Colonoscopy was  repeated.  The colon appeared normal.  Biopsies were taken to rule out  microscopic or collagenous colitis.  These did show some changes of  active colitis with scattered granulocytes, lymphocytes, and plasma  cells.  No ulceration.  No crypt abscess formation.  I am not clear that  she was treated with any specific medication at that time.  With the  above history, she obviously needs to be reassessed for underling  inflammatory bowel disease or microscopic colitis.  As such, is  scheduled for follow-up colonoscopy with biopsy.      Mike Gip, PA-C  Electronically Signed      Iva Boop, MD,FACG  Electronically Signed   AE/MedQ  DD: 08/10/2007  DT: 08/11/2007  Job #: [id number]   cc:   Hedwig Morton. Juanda Chance, MD

## 2010-12-01 ENCOUNTER — Encounter (HOSPITAL_COMMUNITY): Payer: Self-pay

## 2010-12-01 ENCOUNTER — Encounter (HOSPITAL_COMMUNITY)
Admission: RE | Admit: 2010-12-01 | Discharge: 2010-12-01 | Disposition: A | Discharge status: Home / Self Care | Payer: Medicare Other | Source: Ambulatory Visit | Attending: Orthopedic Surgery | Admitting: Orthopedic Surgery

## 2010-12-01 DIAGNOSIS — Z96649 Presence of unspecified artificial hip joint: Secondary | ICD-10-CM | POA: Insufficient documentation

## 2010-12-01 DIAGNOSIS — M25559 Pain in unspecified hip: Secondary | ICD-10-CM | POA: Insufficient documentation

## 2010-12-01 DIAGNOSIS — M25552 Pain in left hip: Secondary | ICD-10-CM

## 2010-12-01 DIAGNOSIS — T84038A Mechanical loosening of other internal prosthetic joint, initial encounter: Secondary | ICD-10-CM

## 2010-12-01 MED ORDER — TECHNETIUM TC 99M MEDRONATE IV KIT
25.0000 | PACK | Freq: Once | INTRAVENOUS | Status: AC | PRN
  Administered 2010-12-01: 24 via INTRAVENOUS

## 2010-12-03 NOTE — H&P (Signed)
[name], [name] NO.:  [id number]   MEDICAL RECORD NO.:  [id number]          PATIENT TYPE:  INP   LOCATION:  NA                           FACILITY:  [facility]   PHYSICIAN:  Ollen Gross, M.D.    DATE OF BIRTH:  [birth year]   DATE OF ADMISSION:  08/23/2006  DATE OF DISCHARGE:                              HISTORY & PHYSICAL   Date of office visit, history and physical July 27, 2006.   CHIEF COMPLAINT:  Right knee pain.   HISTORY OF PRESENT ILLNESS:  The patient is a 77 year old female who has  been seen by Dr. Homero Fellers Aluisio for ongoing right knee pain.  She has  known arthritis which has been progressive in nature.  She has been  followed for quite some time now.  She has been treated conservatively  in the past.  Despite conservative measures, she continues to have pain.  She has significant cardiac history and has been seen recently by Dr.  Regino Schultze office.  She has undergone workup and felt that she is stable  to undergo planned surgery.  Risks and benefits have been discussed and  she elected to proceed with surgery.   ALLERGIES:  1. ULTRAM.  2. PERCOCET.  3. CODEINE.  4. SHELLFISH allergy.   CURRENT MEDICATIONS:  Coumadin, Norvasc, atenolol, Celebrex,  Simvastatin, Cymbalta, Senna, Lasix, potassium, Macrodantin.   PAST MEDICAL HISTORY:  1. Bilateral lower extremity edema.  2. Nonobstructive coronary arterial disease.  3. Recent cardiac catheterization October 2007 with a 40% mid-LAD.      Ejection fraction of 60% per echo.  4. Hypertension.  5. Hyperlipidemia.  6. Asthma.  7. Paroxysm atrial fibrillation.  8. History of pulmonary embolism post surgery January 2006.  9. Irritable bowel syndrome.  10.History of urinary tract infections.  11.Urinary incontinence.   PAST SURGICAL HISTORY:  1. Spinal fusions x4.  2. Left total hip replacement 1991.  3. Sinus surgery.   SOCIAL HISTORY:  Married.  Retired Comptroller.  Nonsmoker.  No alcohol.  Two children.   FAMILY HISTORY:  Heart disease and hypertension.   REVIEW OF SYSTEMS:  GENERAL:  No fevers, chills, night sweats.  NEURO:  No seizures, syncope or paralysis.  RESPIRATORY:  A little bit of  shortness of breath on exertion but no shortness of breath at rest.  No  productive cough or hemoptysis.  CARDIOVASCULAR:  Hypertension with some  coronary arterial disease and also paroxysm atrial fibrillation.  No  shortness of breath at rest.  No chest pain recently.  GI:  Irritable  bowel.  No nausea, no vomiting.  No blood or mucus in the stool.  GU:  She does have some urinary frequency and urgency.  No dysuria,  hematuria.  MUSCULOSKELETAL:  Right knee.   PHYSICAL EXAMINATION:  VITAL SIGNS:  Pulse 64, respirations 12, blood  pressure 120/70.  GENERAL:  A 77 year old female well-developed, well-nourished, mildly  anxious.  She is accompanied by her husband.  She is alert, oriented and  very pleasant.  HEENT:  Normocephalic and atraumatic.  Pupils round and reactive.  Oropharynx clear.  EOMs intact.  Noted to wear glasses.  NECK:  Supple.  CHEST:  Clear.  HEART:  Regular rate and rhythm with a grade 2/6 early systolic ejection  murmur best heard over aortic point and slightly over pulmonic point.  ABDOMEN:  Soft.  Bowel sounds present.  RECTAL/BREASTS/GENITALIA:  Not done, not pertinent to present illness.  EXTREMITIES:  Right knee shows a varus malalignment deformity.  No  effusion.  Range of motion 5 to 110.  Marked crepitus.   IMPRESSION:  1. Osteoarthritis right knee.  2. Bilateral lower extremity edema.  3. Nonobstructive coronary arterial disease.  4. Recent cardiac catheterization October 2007 with a 40% mid-left      anterior descending artery.  5. Ejection fraction of 60% per echocardiogram.  6. Hypertension.  7. Hyperlipidemia.  8. Asthma.  9. Paroxysm atrial fibrillation.  10.History of pulmonary embolism post surgery January 2006.  11.Irritable bowel  syndrome.  12.History of urinary tract infections.  13.Urinary incontinence.   PLAN:  The patient will be admitted to [facility] to undergo a  right total knee arthroplasty.  Surgery will be performed by Dr. Ollen Gross.  Her medical physician, Dr. Jerl Mina and her cardiologist  Dr. Everardo Beals. Juanda Chance, will both be notified of the room number and  admission.  Will be consulted if needed for assistance with this patient  throughout the hospital course.      Alexzandrew L. Julien Girt, P.A.      Ollen Gross, M.D.  Electronically Signed    ALP/MEDQ  D:  08/22/2006  T:  08/23/2006  Job:  [id number]   cc:   Ollen Gross, M.D.  Fax: [phone]   Jerl Mina  Fax: [phone]   Bruce R. Juanda Chance, MD, [facility]  [address]  [place], [place] [address]   Patient's chart

## 2010-12-03 NOTE — Assessment & Plan Note (Signed)
[facility]                            CARDIOLOGY OFFICE NOTE   NAME:[name], [name]                       MRN:          [id number]  DATE:09/28/2006                            DOB:          05/27/1929    PRIMARY CARE PHYSICIAN:  Dr. Jerl Mina, [facility].   MEDICAL HISTORY:  [name] is 77 years old and has been evaluated by  [place] for shortness of breath and had a catheterization which showed mild  nonobstructive coronary disease and she had an echocardiogram which  showed good LV function.  She also has paroxysmal atrial fibrillation  and been on Coumadin.  She has done fairly well from the standpoint of  her heart.  In early February she had total knee replacement on the  right side by Dr. Lequita Halt.  She is currently in rehab from that.  Her  recovery from this has been limited partly because she has degenerative  disk disease in her lower extremities with some weakness in her legs  related to this.   She said she has had no recent chest pain, shortness of breath, or  palpitations.  She does have some chronic edema in the lower extremities  which is slightly worse on the right side since her knee replacement.   PAST MEDICAL HISTORY:  Her past medical history is significant for  hypertension, hyperlipidemia, previous history of pulmonary embolism  following surgery in 2006.   CURRENT MEDICATIONS:  Her current medications include atenolol,  simvastatin, morphine, Celebrex, Norvasc, Cymbalta.   EXAMINATION:  VITAL SIGNS:  The blood pressure was 129/65 and pulse 61  and regular.  NECK:  There was no venous distention.  The carotid pulses were full  without bruits.  CHEST:  Clear without rales or rhonchi.  The cardiac rhythm was regular.  No murmurs or gallops.  ABDOMEN:  The abdomen was soft without organomegaly.  EXTREMITIES:  There was 2+ edema in the right lower extremity and 1+  edema in the left lower extremity.   IMPRESSION:  1.  Nonobstructive coronary disease at catheterization October 2007.  2. Good left ventricular function.  3. Paroxysmal atrial fibrillation currently on Coumadin therapy and      rate control medications.  4. Moderate aortic dilatation by echo October 2007.  5. Venous insufficiency lower extremities.  6. Status post recent total knee replacement.  7. 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  8. Hypertension.  9. Hyperlipidemia.  10.History of pulmonary embolism following surgery January 2006.   RECOMMENDATIONS:  I think [name] is doing well from a cardiac  standpoint.  Dr. Burnett Sheng is following her Coumadin.  We will plan to see  her back in followup in a year or sooner if she develops any recurrent  problems.  She indicated that her husband was recently seen in the ED at  [facility] with chest pain and she requested further cardiac  evaluation and we will arrange for him to be seen by one of our  [facility] doctors in the office there.  Bruce Elvera Lennox Juanda Chance, MD, [facility]  Electronically Signed    BRB/MedQ  DD: 09/28/2006  DT: 09/29/2006  Job #: [id number]

## 2010-12-03 NOTE — Op Note (Signed)
NAMESIANNI, [name] NO.:  [id number]   MEDICAL RECORD NO.:  [id number]          PATIENT TYPE:  INP   LOCATION:  X004                         FACILITY:  [facility]   PHYSICIAN:  Ollen Gross, M.D.    DATE OF BIRTH:  1928-11-23   DATE OF PROCEDURE:  08/23/2006  DATE OF DISCHARGE:                               OPERATIVE REPORT   PREOPERATIVE DIAGNOSES:  Osteoarthritis right knee.   POSTOPERATIVE DIAGNOSES:  Osteoarthritis right knee.   PROCEDURE:  Right total knee arthroplasty.   SURGEON:  Dr. Homero Fellers Aluisio.   ASSISTANT:  Avel Peace, PA-C.   ANESTHESIA:  General with postop Marcaine pain pump.   ESTIMATED BLOOD LOSS:  Minimal.   DRAINS:  Hemovac x1.   TOURNIQUET TIME:  41 minutes at 300 mmHg.   COMPLICATIONS:  None.   CONDITION:  Stable to recovery.   BRIEF CLINICAL NOTE:  [name] is a 77 year old female with severe end-  stage arthritis of the right knee, progressively worsening pain and  dysfunction.  She has failed nonoperative management including  injections and presents for total knee arthroplasty.   PROCEDURE IN DETAIL:  After successful administration of general  anesthetic, a tourniquet was placed high on the right thigh and right  lower extremity prepped and draped in the usual sterile fashion.  Extremities wrapped in Esmarch, knee flexed, tourniquet inflated to 300  mmHg.  A midline incision is made with a 10 blade through the  subcutaneous tissue to the level of the extensor mechanism.  A fresh  blade is used to make a medial parapatellar arthrotomy.  The soft tissue  over proximal and medial tibia is subperiosteally elevated to the joint  line with a knife and into the semimembranosus bursa with a Cobb  elevator.  The soft tissue laterally is elevated with attention being  paid to avoiding the patellar tendon on the tibial tubercle. The patella  is subluxed laterally, knee flexed 90 degrees, ACL and PCL removed.  A  drill was used to  create a starting hole in the distal femur and canal  was thoroughly irrigated.  A 5 degree right valgus alignment guide is  placed and referencing off the posterior condyles rotation is marked and  the block pinned to remove 11 mm off the distal femur. We took 11  because of her flexion contracture.  Distal femoral resection is made  with an oscillating saw and the sizing block is placed.  A size 3 is the  most appropriate and the rotations marked off the epicondylar axis.  A  size 3 cutting block is placed and the anterior, posterior and chamfer  cuts made.   The tibia is subluxed forward and the menisci are removed. The  extramedullary tibial alignment guide is placed referencing proximally  at the medial aspect of tibial tubercle and distally along the second  metatarsal axis and tibial crest.  The block is pinned to remove 10 mm  off the nondeficient lateral side.  We had to go down another 2 in order  to get to the base of the medial defect.  Tibial resection is made with  an oscillating saw.  A size 2.5 is the most appropriate tibial component  and the proximal tibia prepared with the modular drill and keel punch  for a size 2.5.  Femoral preparation is completed with the intercondylar  cut for a size 3.   A size 2.5 mobile bearing tibial trial, size 3 posterior stabilized  femoral trial and a 12.5 mm posterior stabilized rotating platform  insert trial ae placed.  With the 12.5, full extension is achieved with  excellent varus and valgus balance throughout full range of motion.  Osteophytes are then removed off the posterior femur.  The patella was  everted and thickness measured to be 22 mm.  Freehand resection taken to  12 mm, a 35 template is placed, lug holes were drilled, trial patella is  placed and it tracks normally.  Osteophytes removed off the posterior  femur with a trial in place.  All trials are removed and the cut bone  surfaces prepared with pulsatile lavage.  The  cement is mixed and once  ready for implantation, the size 2.5 mobile bearing tibial tray, size 3  posterior stabilized femur and 35 patella are cemented into place and  the patella is held with a clamp.  A trial 12.5 insert is placed, knee  held in full extension and all extruded cement removed.  Once the cement  is fully hardened, then the permanent 12.5 mm posterior stabilized  rotating platform insert is placed into the tibial tray.  The wound was  copiously irrigated with saline solution and the extensor mechanism  closed over a Hemovac drain with interrupted #1 PDS.  Flexion against  gravity is 135 degrees.  The tourniquet was released with a total  tourniquet time of 41 minutes.  The subcu is closed with interrupted 2-0  Vicryl,  subcuticular with running 4-0 Monocryl.  The drain is hooked to  suction and the catheter for Marcaine pain pump was placed and the pump  was initiated.  Steri-Strips and a bulky sterile dressing were applied  and she was placed into a knee immobilizer, awakened and transported to  recovery in stable condition.      Ollen Gross, M.D.  Electronically Signed     FA/MEDQ  D:  08/23/2006  T:  08/23/2006  Job:  [id number]

## 2010-12-03 NOTE — Cardiovascular Report (Signed)
NAMEELAYJAH, CHANEY                ACCOUNT NO.:  [id number]   MEDICAL RECORD NO.:  [id number]          PATIENT TYPE:  OIB   LOCATION:  NA                           FACILITY:  [facility]   PHYSICIAN:  Bruce R. Juanda Chance, MD, FACCDATE OF BIRTH:  14-Aug-1928   DATE OF PROCEDURE:  04/28/2006  DATE OF DISCHARGE:                              CARDIAC CATHETERIZATION   CLINICAL HISTORY:  Mrs. Harbach is 77 years old and has significant problems  with degenerative disc disease and degenerative joint disease.  She was  recently seen in the emergency room with an episode of atrial fibrillation  which responded.  She also had chest pain.  She ruled out for myocardial  infarction and was sent home.  Since that time she has had some exertional  heaviness and shortness of breath but has not been able to be very active  due to her degenerative joint and spine disease.  She also has a history of  a pulmonary embolus postoperatively in 2006 and was on Coumadin therapy for  a short period of time.  She was seen in the office by Tereso Newcomer and  myself and we made a decision to evaluate her further with angiography to  try and help explain her symptoms.   PROCEDURE:  The procedure was performed by the femoral using an arterial  sheath and 4-French preformed coronary catheters.  A front wall arterial  puncture was performed and  Omnipaque contrast was used.  The patient  tolerated the procedure well and left the lab in satisfactory condition.   RESULTS:  The left main coronary artery is free of significant disease.   Left anterior descending artery gave rise to two diagonal branch and three  septal perforators.  There was 40% narrowing in the mid LAD.   The circumflex artery gave rise to a ramus branch, an atrial branch, a  marginal branch, and two posterolateral branches.  These vessels were free  of significant disease.   The right coronary artery is a moderate-sized vessel that gave rise to a  conus  branch, a right ventricle branch, posterior descending and a  posterolateral branch.  These vessels were free of significant disease.   The left ventriculogram performed in the RAO projection showed good wall  motion with no areas of hypokinesis.  The estimated ejection fraction was  70%.  There appeared to be LVH.   The aortic pressure was 122/56 with mean of 84 and left ventricular pressure  was 122/25.   CONCLUSION:  Mild nonobstructive coronary artery disease with 40% narrowing  in the mid left anterior descending artery , no significant obstruction in  the circumflex and right coronary arteries, and normal left ventricular  function with probable left ventricular hypertrophy.   RECOMMENDATIONS:  The patient has only mild nonobstructive coronary disease.  She does have elevated filling pressures and apparently has LVH and does  have a history of hypertension.  She may have some element of  diastolic dysfunction contributing to shortness of breath.  I think her  risks for a surgical procedure should be fairly low from  a cardiac  standpoint.  We will plan to see her him back in followup in a week and will  plan to a do 2-D echocardiogram to further evaluate her LVH.           ______________________________  Everardo Beals. Juanda Chance, MD, [facility]     BRB/MEDQ  D:  04/28/2006  T:  04/30/2006  Job:  [id number]   cc:   Tyrone Apple, M.D.  Cardiopulmonary Lab

## 2010-12-03 NOTE — Assessment & Plan Note (Signed)
[facility]                              CARDIOLOGY OFFICE NOTE   NAME:Gasparini, DELCIE RUPPERT                       MRN:          [id number]  DATE:04/10/2006                            DOB:          11-26-1928    HISTORY OF PRESENT ILLNESS:  Ms. Diver is a very pleasant 77 year old  female patient who has a remote cardiac history for insignificant blockage  on a catheterization that she thinks was done in 1998 at [facility]  as well as significant degenerative disc disease status post multiple spinal  surgeries complicated by right lower extremity weakness who presents to the  office today for further evaluation of palpitations and chest discomfort.  The patient thinks she has had a history of chest pain and palpitations over  the last year, but she has had significant problems with her back and right  lower extremity and has not really paid this much attention.  On September  5, she developed tachy palpitations and chest heaviness that she describes  as an elephant sitting on her chest.  This occurred while doing minimal  activity.  Her husband took her directly to the emergency room.  Apparently,  she ruled out for myocardial infarction.  She had an EKG done that did show  atrial fibrillation with rapid ventricular rate with a heart rate in the  130s.  She apparently converted to sinus rhythm while in the emergency room  that night.  Her symptoms she thinks lasted about an hour.  Since that visit  to the emergency room, she has had several episodes of exertional chest  heaviness and shortness of breath.  She is not very active secondary to her  back and lower extremity problems.  However, with this she does note  associated diaphoresis.  There are no radiating symptoms.  She does feel  light headed but there is no syncope.  She denies orthopnea or paroxysmal  nocturnal dyspnea.  She denies pleuritic chest pain or significant pedal  edema.   PAST MEDICAL HISTORY:  Significant for probable minimal non-obstructive  coronary artery disease by catheterization in 1998.  She thinks she was  diagnosed with a myocardial infarction at some point in the past.  This was  by an ER physician who saw her for an unrelated problem.  She has a history  of degenerative disc disease and is status post spinal fusion x4.  After her  last spinal fusion in January 2006, she developed a pulmonary embolism and  remained on Coumadin for several months afterwards.  She has a history of  hypertension, but denies this.  She denies a history of diabetes mellitus.  There is a history of treated hypercholesterolemia.  She has a history of  probable inflammatory bowel disease as well as asthma since childhood.  She  has a history of gallbladder polyp.  There is a history of normocytic  anemia, as well.  She has had a history of sigmoid resection secondary to  stricture, total abdominal hysterectomy and bilateral salpingo-oophorectomy,  left total hip replacement, and left Achilles tendon repair.  She had  meningitis in her college years.  She is status post sinus surgery in 1986  with repeat in 1989 or 1990.  She has a past history of Scarlet fever.   CURRENT MEDICATIONS:  Norvasc 5 mg daily, Clonazepam 1 mg p.r.n., Atenolol  25 mg daily, Effexor 37.5 mg daily, Lyrica 75 mg b.i.d., Simvastatin 40 mg  daily, aspirin 81 mg 2 tablets daily.   ALLERGIES:  SHELLFISH, LEVAQUIN, DILAUDID, ULTRAM, ZINACEF, SULFA, MORPHINE,  OXYCONTIN, DARVOCET, ROBAXIN.   SOCIAL HISTORY:  The patient denies any current tobacco or alcohol abuse.  She quit smoking in 1976.  She lives at [facility] in [place] with her  husband who also has Parkinson's disease.  She is a retired Comptroller.   FAMILY HISTORY:  Significant for coronary artery disease.  Her father died  of a myocardial infarction at age 50.  She has several aunts and cousins who  also have coronary disease.   REVIEW  OF SYMPTOMS:  Please see HPI.  She denies fevers, chills, cough,  melena, hematochezia, hematuria, dysuria.  She has significant right lower  extremity weakness as well as low back pain.  She denies any dysphagia or  odynophagia.  The rest of the review of systems are negative.   PHYSICAL EXAMINATION:  GENERAL:  Well developed, well nourished elderly female in no acute  distress.  VITAL SIGNS:  Blood pressure 117/65, pulse 68, weight 175 pounds.  HEENT:  Head normocephalic, atraumatic.  Eyes:  PERRLA, EOMI, sclerae clear.  NECK:  Without lymphadenopathy. No thyromegaly.  No JVD.  CARDIAC:  Normal S1 and S2, regular rate and rhythm, no murmurs.  LUNGS:  Clear to auscultation bilaterally without wheezes, rhonchi, or  rales.  ABDOMEN:  Soft, nontender.  EXTREMITIES:  Trace to 1+ edema, calves are soft and nontender.  SKIN:  Warm and dry.  NEUROLOGICAL:  She is alert and oriented x3.  Cranial nerves 2-12 are  grossly intact.  She has significant deficits of her right lower extremity  with mild foot drop.  PSYCHIATRIC:  She answers all questions appropriately and seems to be  euthyroid.   IMPRESSION:  1. Chest pain and dyspnea concerning for unstable angina pectoris.  2. Paroxysmal atrial fibrillation with rapid ventricular rate.  3. Questionable history of minimal non-obstructive coronary artery disease      by catheterization in 1998.  4. Pulmonary embolism postoperatively January 2006.      a.     Previous Coumadin therapy.  5. Degenerative disc disease status post multiple spinal surgeries with      resultant right lower extremity weakness.  6. Hypertension.  7. Hyperlipidemia.  8. Irritable bowel disease.  9. Asthma since childhood.  10.Status post multiple surgeries as noted above.  11.Shellfish allergy.   PLAN:  The patient was also examined by Dr. Juanda Chance.  She recently did have a  Dobutamine Myoview at clinic.  This showed no ischemia.  However, given her continued  symptoms that sound significant and her risk factors, we think it  is best to proceed with cardiac catheterization to define her coronary  anatomy.  We will set her up in the CV lab next week.  We will pre-medicate  her for her shellfish allergy.  We will also increase her Atenolol to 25 mg  b.i.d.  We will check a TSH with her labs for catheterization.  We will also  check an echocardiogram.  We will make further recommendations regarding her  upcoming knee surgery after  the results of catheterization are known.  We  will also likely initiate Coumadin post catheterization given her history of  paroxysmal atrial fibrillation and significant risk factors.                                  Tereso Newcomer, PA-C                            Bruce R. Juanda Chance, MD, [facility]   SW/MedQ  DD:  04/10/2006  DT:  04/12/2006  Job #:  [id number]   cc:   Jerl Mina, M.D.  Hedwig Morton. Juanda Chance, MD  Donette Larry, M.D., [facility]  Ollen Gross, M.D.  Claude Manges. Cleophas Dunker, M.D.

## 2010-12-03 NOTE — Assessment & Plan Note (Signed)
[facility]                            CARDIOLOGY OFFICE NOTE   NAME:[name], [name]                       MRN:          [id number]  DATE:07/31/2006                            DOB:          06-Jan-1929    CARDIOLOGIST:  Dr. Charlies Constable.   PRIMARY CARE PHYSICIAN:  Dr. Jerl Mina at the [facility].   HISTORY OF PRESENT ILLNESS:  [name] is a 77 year old female patient  followed by Dr. Juanda Chance with a history of nonobstructive coronary disease  by recent catheterization as well as good LV function who presents to  the office today for followup.  I saw her last on June 30, 2006 with  complaints of lower extremity edema.  It is worse on the right than the  left.  She had lower extremity Dopplers done at that time that were  negative for DVT.  She is on Coumadin for paroxysmal atrial  fibrillation.  She denies any history of stroke.  There is no history of  valve repair or replacement.  She is in need of total knee replacement.  Dr. Sherlean Foot plans to do this August 23, 2006.  Her lower extremity edema  is unchanged.  She has been taking Lasix every other day to every couple  of days.  She has really not noticed much of a change.  She notes  chronic dyspnea on exertion and there has been no change there.  She  denies any chest pain.  She has occasional palpitations, especially in  the morning.  She has occasional fleeting headaches that last less than  a couple of minutes.  But these are not increasing in frequency or  intensity, and there are no associated symptoms.  She has already talked  to her primary care physician about this.   CURRENT MEDICATIONS:  1. Atenolol 25 mg twice a day.  2. Simvastatin 40 mg a day.  3. Warfarin as directed.  4. Celebrex 10 mg a day.  5. Norvasc 5 mg a day.  6. Senna 2 tablets b.i.d.  7. Cymbalta daily.  8. Clonazepam p.r.n.  9. Tylenol p.r.n.   ALLERGIES:  SHELLFISH, LEVAQUIN, DILAUDID, ULTRAM, ZINACEF,  SULFA,  MORPHINE, OXYCONTIN, DARVOCET, ROBAXIN.   SOCIAL HISTORY:  She denies any tobacco or alcohol abuse.   REVIEW OF SYSTEMS:  Please see HPI.  Denies any fevers.  She has had  some chills.  Denies any cough.  Denies any hemoptysis.  Denies any  melena, hematochezia, hematuria, dysuria.  The rest of review of systems  are negative.   PHYSICAL EXAMINATION:  She is well-nourished, well-developed female in  no distress.  Blood pressure 110/70, pulse 88, weight 192 pounds.  HEENT:  Unremarkable.  NECK:  No JVD at 90 degrees.  CARDIAC:  An S1, S2, regular rhythm without murmurs, clicks, rubs, or  gallops.  LUNGS:  Clear to auscultation bilaterally without wheeze or rhonchi or  rales.  ABDOMEN:  Soft, nontender.  EXTREMITIES:  With trace to 1+ edema bilaterally.  Calves are soft,  nontender.  SKIN:  Warm and dry.  NEUROLOGIC:  She  is alert and oriented x3, cranial nerves II-XII grossly  intact.   Electrocardiogram reveals sinus rhythm with a heart rate of 58,  nonspecific ST-T wave changes.   IMPRESSION:  1. Nonobstructive coronary disease by catheterization on April 28, 2006 revealing 40% mid left anterior descending stenosis.  2. Good left ventricular function.      a.     Ejection fraction 60% by echocardiogram October 2007.  3. Moderate aortic dilitation by echocardiogram October 2007.  4. Peripheral edema.  5. Chronic dyspnea on exertion - probably multifactorial including      deconditioning.  6. Degenerative joint disease.      a.     Needs total knee replacement with Dr. Sherlean Foot on August 23, 2006.  7. History of multiple back surgeries resulting in neurologic deficit      and weakness in her right leg that requires use of a walker.  8. Hypertension.  9. Hyperlipidemia.  10.Irritable bowel syndrome.  11.History of pulmonary embolism, status post surgery January 2006.  12.Paroxysmal atrial fibrillation on Coumadin therapy.  13.SHELLFISH ALLERGY.    PLAN:  I discussed patient's case today with Dr. Juanda Chance.  At this point  in time she requires no further cardiac workup prior to her noncardiac  surgery and she should be at acceptable risk.  Our service is certainly  available in the perioperative period as necessary.  For her peripheral  edema we did check some blood work back in December.  Her CBC and CMET  were both normal.  Her BNP was only mildly elevated at 180 and she had a  recent TSH in October that was normal.  I think she should get back to  wearing compression stockings.  She can continue to use Lasix p.r.n.  coupled with potassium.  We will set her up for a BMET today to followup  on  her renal function and potassium.  I will bring her back in followup  with Dr. Juanda Chance in about 6 or 8 weeks.      Tereso Newcomer, PA-C  Electronically Signed      Everardo Beals. Juanda Chance, MD, [facility]  Electronically Signed   SW/MedQ  DD: 07/31/2006  DT: 07/31/2006  Job #: [id number]   cc:   Jerl Mina

## 2010-12-03 NOTE — Assessment & Plan Note (Signed)
[facility]                              CARDIOLOGY OFFICE NOTE   NAME:[name], ADAMARIE IZZO                       MRN:          [id number]  DATE:05/12/2006                            DOB:          [birth year]    PRIMARY CARE PHYSICIAN:  Dr. Jerl Mina at [facility].   CLINICAL HISTORY:  [name] is 77 years old, and was recently seen by  Tereso Newcomer and myself in consultation for symptoms of chest pain and  shortness of breath.  She had been seen in the emergency room with an  episode of atrial fibrillation with rapid rate, and some associated chest  pain with that.  After we saw her in consultation we arranged for her to be  evaluated with an echocardiogram and a catheterization.  The catheterization  test shows only mild, nonobstructive coronary disease, and her  echocardiogram was normal.  Based on this, we reassured her that her heart  was in good shape, and thought that it was okay for her to proceed with knee  replacement surgery, which she plans in a few months.   She has done well since discharge from the hospital, and has had no  recurrent cardiac symptoms.  She does have a history of palpitations in the  past in addition to the documented episode of atrial fibrillation that she  had in the emergency room.   PAST MEDICAL HISTORY:  Significant for:  1. Multiple back surgeries, which has left her with a neurological deficit      with weakness in her right leg that requires use of a walker.  2. She also has degenerative joint disease, and is planning to have knee      replacement surgery with Dr. Sherlean Foot.  3. Hypertension.  4. Hyperlipidemia.  5. Irritable bowel syndrome, for which she sees Dr. Lina Sar.  6. She also has a history of pulmonary embolism postoperatively in January      of 2006, and had been on Coumadin therapy for a short time after than.   CURRENT MEDICATIONS:  Norvasc, Effexor, simvastatin, aspirin, atenolol and  Lyrica.   PHYSICAL EXAMINATION:  We did not get a blood pressure, her pulse was 60.  There was no venous distention.  The carotid pulses were full without  bruits.  CHEST:  Clear.  CARDIAC:  Rhythm was regular.  I can hear no murmurs or gallops.  ABDOMEN:  Soft with normal bowel sounds.  The __________  site was well healed.  There was no peripheral edema.   IMPRESSION:  1. Nonobstructive pulmonary artery disease and normal left ventricular      function.  2. Paroxysmal atrial fibrillation.  3. Severe degenerative disk disease, status post multiple surgeries with      right lower extremity weakness.  4. Hypertension.  5. Hyperlipidemia.  6. Irritable bowel syndrome.  7. Degenerative joint disease of the knees.  8. History of pulmonary embolism January 2006.   RECOMMENDATIONS:  I think [name] is doing well from a cardiac  standpoint.  I think she should be at low cardiac  risk for knee replacement  surgery, and I think that it should be okay to proceed with that.  She does  have atrial fibrillation, and with hypertension and her age I think she is  at mildly high risk for a systemic embolization.  I would recommend Coumadin  therapy.  We will plan to start this today, with plans for her to follow up  with Dr. Burnett Sheng for monitoring.  I will plan to see her back in a year.    ______________________________  Everardo Beals. Juanda Chance, MD, [facility]    BRB/MedQ  DD: 05/12/2006  DT: 05/14/2006  Job #: [id number]   cc:   Jerolyn Shin

## 2010-12-03 NOTE — Discharge Summary (Signed)
[name]                ACCOUNT NO.:  [id number]   MEDICAL RECORD NO.:  [id number]          PATIENT TYPE:  INP   LOCATION:  1518                         FACILITY:  [facility]   PHYSICIAN:  Ollen Gross, M.D.    DATE OF BIRTH:  13-Jun-1929   DATE OF ADMISSION:  08/23/2006  DATE OF DISCHARGE:  08/29/2006                               DISCHARGE SUMMARY   ADMITTING DIAGNOSES:  1. Osteoarthritis, right knee.  2. Bilateral lower extremity edema.  3. Nonobstructive coronary arterial disease.  4. Recent cardiac catheterization, October 2007, with a 4% mid left      anterior descending artery.  5. Ejection fraction of 60% per echocardiogram.  6. Hypertension.  7. Hyperlipidemia.  8. Asthma.  9. Paroxysmal atrial fibrillation.  10.History of pulmonary embolism, post surgery, January 2006.  11.Irritable bowel syndrome.  12.History of urinary tract infections.  13.Urinary incontinence.   DISCHARGE DIAGNOSES:  1. Osteoarthritis, right knee, status post right total knee      arthroplasty.  2. Acute blood loss anemia.  3. Status post transfusion without sequelae.  4. Postoperative hyponatremia, improved.  5. Bilateral lower extremity edema.  6. Nonobstructive coronary arterial disease.  7. Recent cardiac catheterization, October 2007, with a 4% mid left      anterior descending artery.  8. Ejection fraction of 60% per echocardiogram.  9. Hypertension.  10.Hyperlipidemia.  11.Asthma.  12.Paroxysmal atrial fibrillation.  13.History of pulmonary embolism, post surgery, January 2006.  14.Irritable bowel syndrome.  15.History of urinary tract infections.  16.Urinary incontinence.   PROCEDURE:  August 23, 2006, right total knee, surgeon -- Dr. Lequita Halt,  assistant -- Avel Peace, PA-C, anesthesia -- general with  postoperative Marcaine pain pump, tourniquet time 41 minutes.   CONSULTS:  None.   BRIEF HISTORY:  [name] is a 77 year old female who has been seen by  Dr.  Lequita Halt for ongoing right knee pain, known arthritis, refractory  nonoperative management, who now presents for total knee arthroplasty.   LABORATORY DATA:  Preop CBC showed hemoglobin of 12.5, hematocrit of  37.6, white cell count 4.4; postop hemoglobin 9.2 and drifted down to  8.5; she was given 2 units of blood, post-transfusion hemoglobin 10.6,  last noted H&H 10.3 and 30.6.  PT and PTT preop 13.6 and 30,  respectively.  INR 1.0.  Serial pro times followed.  On last noted PT  and INR, INR came up to a normal therapeutic level of 2.5, although it  started drifting down; last noted PT and INR 17.9 and 1.4.  Chemistry  panel on admission slightly elevated with total bilirubin of 1.4,  remaining chemistry panel within normal limits.  Serial BMETs were  followed; sodium did drop from 138 to 132 and back up to 136.  Preop UA:  Small hemoglobin, otherwise negative with 0-2 red cells.  Followup UA on  August 27, 2006 negative.  Blood group type A positive.   RADIOLOGIC FINDINGS:  Chest x-ray, August 16, 2006:  Mild cardiomegaly  without evidence of edema, old granulomatous disease.   Followup chest x-ray:  Suspicious for mild congestive heart failure,  small right effusion.   ACCESSORY CLINICAL DATA:  EKG, August 15, 2006:  Sinus tachycardia with  premature supraventricular complexes, left anterior fascicular block  compared to previous EKG of August 14, 2006, confirmed by Dr. Cheri Guppy.   HOSPITAL COURSE:  The patient was admitted to [facility] and  tolerated the procedure well, later transferred to the recovery room and  to the orthopedic floor, started on PCA and p.o. analgesics for pain  control following surgery.  She had a fair amount of pain through the  night, fairly uncomfortable.  She had multiple allergies preop;  therefore, she was started on PCA and pain medications with Benadryl.  By the next morning, she was still uncomfortable, had some back pain   from being in the bed and I informed the patient she could do some log-  rolling and started to wean over to p.o. medications and encouraged to  take the Benadryl with the p.o. medications.  Hemoglobin was down to  9.2, otherwise no complaints.  Starting up out of bed by day #2.  She  was still itching.  Her hemoglobin was a little bit lower, down to 8.5;  due to her history, I recommended blood.  She initially received 2 units  of blood and I encouraged pulmonary toilet with a history of asthma and  was on Lovenox and Coumadin because she had a past history of PE.  She  was slowly getting up with Physical Therapy.  Discharge Planning was  consulted to assist with placement of the patient; she wanted to look  into a skilled inpatient rehab facility.  By day #3, she was doing  better.  Dressing was changed on day #2; incision looked good.  By day  #3, she was doing a little bit better with her physical therapy.  She  was still requiring moderate assistance and slowly moving, but pain was  under better control.  By day #4, she was getting up, ambulating about  50 feet.  Itching had improved.  Incision continued to heal well.  Discharge Planning was working on bed availability for the first of the  week.  On August 28, 2006, it was noted that she had a little bit of  shortness of breath on exertion, but none at rest.  Chest x-ray was  checked, which was suspicious of mild congestive heart failure with a  small right effusion; however, through the morning, once she was up  moving around a little bit, the shortness of breath on exertion did  resolve and clinically she was much improved.  A UA was checked on  postop day #4 because of a history of urinary tract infections; UA  proved to be negative.  The remainder of the day she got up without  difficulty and no complaints.  She did improve clinically with  encouragement of incentive spirometer.  By the following day of August 29, 2006, the  shortness of breath had completely resolved  clinically.  She was much improved.  Her INR had drifted back down below  a therapeutic level, so we reinitiated Lovenox treatment.  Discharge  planning had been working along with the patient and they found a bed at  [facility]; the patient was in agreement.  She was stable and was going  to be discharged out later that day.   DISCHARGE PLANS:  1. The patient is discharged to [facility] on August 29, 2006.  2. Discharge diagnoses:  Please see above.  3. Discharge medications:      a.     Coumadin protocol -- please titrate the INR between a target       level of 2.0 and 3.0 (please note, she will be on Lovenox until       her INR is back up to 2.0 or greater).      b.     Lovenox 30 mg subcu q.12 h. (may stop Lovenox once INR is       2.0 or greater).      c.     Colace 100 mg p.o. b.i.d.      d.     Senokot two tabs p.o. daily.      e.     Atenolol 25 mg p.o. b.i.d.      f.     Norvasc 5 mg p.o. daily, hold if systolic blood pressure       less than 100.      g.     Celebrex 200 mg daily.      h.     Zocor 40 mg daily.      i.     Cymbalta 30 mg daily.      j.     Norco 5/325 mg one or two every 4-6 hours as needed for       pain.      k.     Reglan 10 mg p.o. q.6-8 h. p.r.n. nausea.      l.     Tylenol one or two every 4-6 hours as needed for mild pain,       temperature or headache.      m.     Lasix 40 mg p.o. daily p.r.n.      n.     K-Dur 20 mEq p.o. daily p.r.n., give with Lasix if needed.      o.     Nitrostat 0.4 mg sublingual every 5 minutes p.r.n. chest       pain.      p.     Lomotil one tab p.o. p.r.n.      q.     Benadryl 25 mg p.o. q.4-6 h. p.r.n. itching, may give with       p.o. pain medication.      r.     Valium 2 mg p.o. q.8 h. p.r.n. spasm.  4. Diet:  Heart-healthy cardiac diet.  5. Activity:  She may be weightbearing as tolerated to the right lower      extremity.  She will need to be on a total knee  protocol, range of      motion, gait training and strengthening exercises per PT and OT for      ADLs.  She may shower; however, do not submerge the incision under      water.   FOLLOWUP:  She needs to follow up with Dr. Lequita Halt approximately 2 weeks  after the date of surgery of August 23, 2006; please contact the office  at [phone] to help the patient arrange for followup care.   DISPOSITION:  [place].   CONDITION UPON DISCHARGE:  Improved.   FURTHER LABORATORY DATA:  She will need daily pro time and INRs drawn  until Coumadin is therapeutic and then drawn per pharmacy protocol.      Alexzandrew L. Julien Girt, P.A.      Ollen Gross, M.D.  Electronically Signed    ALP/MEDQ  D:  08/29/2006  T:  08/29/2006  Job:  [id number]   cc:   Jerl Mina  Fax: [phone]   Bruce R. Juanda Chance, MD, [facility]  [address]  [place], [place] [address]

## 2010-12-03 NOTE — Assessment & Plan Note (Signed)
[facility]                            CARDIOLOGY OFFICE NOTE   NAME:[name], [name]                       MRN:          [id number]  DATE:06/30/2006                            DOB:          1928-11-11    CARDIOLOGIST:  Everardo Beals. Juanda Chance, MD, [facility]   PRIMARY CARE PHYSICIAN:  Dr. Jerl Mina.   HISTORY OF PRESENT ILLNESS:  [name] is a 77 year old female patient  followed by Dr. Juanda Chance with history of nonobstructive coronary artery  disease by recent cardiac catheterization as well as good LV function  who presents to the office today for lower extremity edema.  She has had  problems with degenerative joint disease in her right knee.  She has  been seeing Dr. Lequita Halt for this and recently had steroid injection.  Over the last couple of weeks, she has had a significant increase in the  amount of swelling in the legs.  Dr. Lequita Halt noted this earlier this  week and suggested she come back and see Dr. Juanda Chance right away.  She  notes that her swelling is worse on the right than on the left.  She  denies any chest pain.  She has dyspnea on exertion that is chronic, but  there has been no change.  She denies any syncope.  She had some  dizziness ever since her back surgery a couple of years ago.  That  usually occurs with standing.  That has not changed any.  She denies any  true orthopnea or paroxysmal nocturnal dyspnea.  She denies any  significant cough.  There is no hemoptysis.  She has felt occasional  palpitations.   CURRENT MEDICATIONS:  1. Norvasc 5 mg daily.  2. Simvastatin 40 mg daily.  3. Senna daily.  4. Atenolol 25 mg b.i.d.  5. Cymbalta daily.  6. Coumadin as directed.  7. Tylenol as needed.  8. MiraLax as needed.  9. Senna as needed.  10.Celebrex as needed.   ALLERGIES:  SHELLFISH, LEVAQUIN, DILAUDID, ULTRAM, ZINACEF, SULFA,  MORPHINE, OXYCONTIN, DARVOCET, ROBAXIN.   REVIEW OF SYSTEMS:  Please see HPI.  Denies any melena,  hematochezia,  hematuria, dysuria.   PHYSICAL EXAMINATION:  GENERAL:  Well-developed, well-nourished female.  VITAL SIGNS:  Blood pressure 122/74, pulse 64, weight 187 pounds.  HEENT:  Normocephalic, atraumatic.  Eyes:  PERRLA.  EOMI.  Sclerae are  clear.  NECK:  Without appreciable JVD.  No lymphadenopathy noted.  CARDIAC:  Normal S1, S2.  Regular rate and rhythm.  LUNGS:  Clear to auscultation bilaterally without wheezes, rales or  rhonchi.  ABDOMEN:  Soft, nontender.  EXTREMITIES:  With 1-2+ edema bilaterally with the right being slightly  worse than the left.  There are multiple varicosities noted, more so on  the right than on the left.  Calves are soft, nontender bilaterally.  There is no erythema.  SKIN:  Warm and dry.  NEUROLOGICAL:  Alert and oriented x3.  Cranial nerves II-XII grossly  intact.   STUDIES:  Bilateral venous Doppler's in the office revealed no evidence  of lower extremity DVT or superficial venous  thrombosis or incompetence  bilaterally.   INR today is 1.5.   IMPRESSION:  1. Bilateral lower extremity edema.  2. Nonobstructive coronary disease by recent cardiac catheterization      April 28, 2006.  She has 40% mid LAD.  No significant obstruction      of the circumflex and RCA.  3. Good LV function.      a.     EF 60% by echocardiogram October 2007.      b.     Moderate aortic root dilatation by recent echocardiogram.  4. Degenerative joint disease.  The patient to have knee replacement      early next year.  5. Status post multiple back surgeries, resulting in neurologic      deficit weakness in her right leg that requires use of a walker.  6. Hypertension, controlled.  7. Hyperlipidemia, treated.  8. Irritable bowel syndrome.  9. History of pulmonary embolism status post surgery in January 2006.  10.Paroxysmal atrial fibrillation, Coumadin therapy.  11.Shellfish allergy.   PLAN:  The patient presents to the office today with complaints of  lower  extremity edema.  This is much worse than she has had in the past.  She  has always had a little bit of swelling in her legs.  This is worse over  the last couple of weeks.  Her INR was subtherapeutic in the office  today.  We elected to do an ultrasound on both legs since she has  swelling to both and she has a history of pulmonary embolus.  The  ultrasound was negative for DVT bilaterally.  She really does not appear  to be in heart failure, and she has had a history of good LV function in  the past.  We will get a C-met, CBC and a BNP today.  We will treat her  with Lasix p.r.n.  She is to take 40 mg a day as needed as well as  potassium 20 mEq daily as needed.  I told her she could take  the Lasix probably for the next 2-3 days and see how she does and then  as needed after that.  Will repeat a B-met when she comes back next week  to see Dr. Juanda Chance in follow up.      Tereso Newcomer, PA-C  Electronically Signed      Pricilla Riffle, MD, [facility]  Electronically Signed   SW/MedQ  DD: 06/30/2006  DT: 06/30/2006  Job #: [id number]   cc:   Tyrone Apple, M.D.

## 2010-12-08 ENCOUNTER — Ambulatory Visit (INDEPENDENT_AMBULATORY_CARE_PROVIDER_SITE_OTHER): Payer: Medicare Other | Admitting: Emergency Medicine

## 2010-12-08 DIAGNOSIS — I4891 Unspecified atrial fibrillation: Secondary | ICD-10-CM

## 2010-12-08 DIAGNOSIS — Z7901 Long term (current) use of anticoagulants: Secondary | ICD-10-CM

## 2010-12-08 LAB — POCT INR: INR: 3.4

## 2010-12-29 ENCOUNTER — Encounter: Payer: Medicare Other | Admitting: Emergency Medicine

## 2011-01-05 ENCOUNTER — Encounter: Payer: Medicare Other | Admitting: Emergency Medicine

## 2011-01-05 ENCOUNTER — Ambulatory Visit (INDEPENDENT_AMBULATORY_CARE_PROVIDER_SITE_OTHER): Payer: Medicare Other | Admitting: Emergency Medicine

## 2011-01-05 DIAGNOSIS — Z7901 Long term (current) use of anticoagulants: Secondary | ICD-10-CM

## 2011-01-05 DIAGNOSIS — I4891 Unspecified atrial fibrillation: Secondary | ICD-10-CM

## 2011-01-07 ENCOUNTER — Encounter: Payer: Self-pay | Admitting: Cardiovascular Disease

## 2011-02-02 ENCOUNTER — Ambulatory Visit (INDEPENDENT_AMBULATORY_CARE_PROVIDER_SITE_OTHER): Payer: Medicare Other | Admitting: Emergency Medicine

## 2011-02-02 DIAGNOSIS — Z7901 Long term (current) use of anticoagulants: Secondary | ICD-10-CM

## 2011-02-02 DIAGNOSIS — I4891 Unspecified atrial fibrillation: Secondary | ICD-10-CM

## 2011-02-18 ENCOUNTER — Emergency Department: Payer: Self-pay | Admitting: Emergency Medicine

## 2011-02-21 ENCOUNTER — Ambulatory Visit: Payer: Self-pay | Admitting: Orthopedic Surgery

## 2011-03-02 ENCOUNTER — Encounter: Payer: Medicare Other | Admitting: Emergency Medicine

## 2011-04-07 ENCOUNTER — Encounter: Payer: Self-pay | Admitting: *Deleted

## 2011-04-07 ENCOUNTER — Ambulatory Visit: Payer: Medicare Other | Admitting: Nurse Practitioner

## 2011-04-07 ENCOUNTER — Telehealth: Payer: Self-pay | Admitting: Internal Medicine

## 2011-04-07 NOTE — Telephone Encounter (Signed)
Spoke with [name] and patient wants to be seen on a Friday between 9:00-11:30 AM but not tomorrow due to she needs to arrange transportation. Scheduled patient on 04/15/11 at 10:00 AM with Mike Gip, PA. Kim to fax last dictation and records for nursing home.

## 2011-04-08 ENCOUNTER — Telehealth: Payer: Self-pay | Admitting: *Deleted

## 2011-04-08 NOTE — Telephone Encounter (Signed)
She will call and reschedule after she sees the orthopedic surgeon on 04/20/11. Notified Kim with Dr. Webb Silversmith office that patient has cancelled her appointment until this date.

## 2011-04-15 ENCOUNTER — Ambulatory Visit: Payer: Medicare Other | Admitting: Physician Assistant

## 2011-04-29 ENCOUNTER — Telehealth: Payer: Self-pay | Admitting: Cardiovascular Disease

## 2011-04-29 NOTE — Telephone Encounter (Signed)
Notified patient can come to see Dr. Mariah Milling on Tuesday, May 10, 2011 @ 3:15. Told the patient we will call back if any further instructions given by Dr. Mariah Milling.

## 2011-04-29 NOTE — Telephone Encounter (Signed)
Notified patient need to come for an EKG on Monday, Oct. 15, 2012 at 11:00. The patient was also instructed to take lasix for the shortness of breath over the weekend.

## 2011-04-29 NOTE — Telephone Encounter (Signed)
Pt having some chest tightness with excertion. Pt states she can come on Monday 10/15 at 3:30 has not ride. I explained to pt that we may not be able to accommodate her for that day and time but the nurse would call her

## 2011-04-29 NOTE — Telephone Encounter (Signed)
The patient came home from skilled nursing on Monday, April 25, 2011.  She has chest tightness with shortness of breath on exertion.  She has pounding in her chest. She does not have any idea what blood pressure or heart rate is. If she is sitting still, the symptoms are fine.

## 2011-04-29 NOTE — Telephone Encounter (Signed)
She does not have a way to get here today.  She would like to know if can be seen on Monday @ 3:30 her ride can only bring her at that time if you need her to be seen.

## 2011-05-02 ENCOUNTER — Ambulatory Visit (INDEPENDENT_AMBULATORY_CARE_PROVIDER_SITE_OTHER): Payer: Medicare Other | Admitting: *Deleted

## 2011-05-02 DIAGNOSIS — R0789 Other chest pain: Secondary | ICD-10-CM

## 2011-05-02 DIAGNOSIS — Z7901 Long term (current) use of anticoagulants: Secondary | ICD-10-CM

## 2011-05-02 DIAGNOSIS — R0602 Shortness of breath: Secondary | ICD-10-CM

## 2011-05-02 DIAGNOSIS — I4891 Unspecified atrial fibrillation: Secondary | ICD-10-CM

## 2011-05-02 LAB — POCT INR: INR: 1.8

## 2011-05-02 MED ORDER — FUROSEMIDE 40 MG PO TABS: 40.0000 mg | ORAL_TABLET | Freq: Two times a day (BID) | ORAL | Status: DC | PRN

## 2011-05-02 NOTE — Progress Notes (Signed)
Pt c/o tightness in chest and SOB. Per phone note from 10/12, pt only took lasix 40mg  once daily on Sat and Sun. Pt has difficulty walkig (back issues) and has urinary incontinence problems, she is very hesitant to take diuretics. I discussed with pt the importance of getting fluid off if this is the cause of her symptoms and will need to take lasix 40 bid temporarily until fluid status stable. BMP and BNP drawn today to determine if this is cause. EKG also done today, unchanged from previous HR 65 NSR w/ RBBB.  Pt also has not had INR visit here due to being in [facility] (now in assisted living) and they had followed INR's. INR checked today at 1.8 and she wants to continue care here. I have requested notes from [facility] regarding documentation of her results and current coumadin dosage. I advised her to take an extra tablet this evening only and then resume her current dosage. Will set her up with ROV, see coumadin note from today. Pt will call back later this week to update on status after taking lasix, she also has f/u appt next week with TG.

## 2011-05-03 LAB — BASIC METABOLIC PANEL
BUN/Creatinine Ratio: 32 — ABNORMAL HIGH (ref 11–26)
BUN: 36 mg/dL — ABNORMAL HIGH (ref 8–27)
Chloride: 104 mmol/L (ref 97–108)
GFR calc Af Amer: 53 mL/min/{1.73_m2} — ABNORMAL LOW (ref >59)
GFR calc non Af Amer: 46 mL/min/{1.73_m2} — ABNORMAL LOW (ref >59)
Glucose: 92 mg/dL (ref 65–99)
Potassium: 4.8 mmol/L (ref 3.5–5.2)

## 2011-05-03 LAB — BRAIN NATRIURETIC PEPTIDE: BNP: 219.3 pg/mL — ABNORMAL HIGH (ref 0.0–100.0)

## 2011-05-06 ENCOUNTER — Telehealth: Payer: Self-pay | Admitting: *Deleted

## 2011-05-06 NOTE — Telephone Encounter (Signed)
Notified pt of msg below, pt ok with this and will f/u 10/23.

## 2011-05-06 NOTE — Telephone Encounter (Signed)
Discussed below with Dr. Mariah Milling. Pt does have h/o swelling in RLE > LLE due to knee injury and venous insufficiency. TED hose and leg elevation were recommended at that time. Per TG, pain could be r/t another cause, possible gout? May need to be evaluated by pt's pcp. Otherwise, will evaluate at pt's f/u next Tuesday, and if he thinks she needs u/s we can possibly do that day or send elsewhere if needed. Pt is on coumadin and this would be the tx.  Attempted to contact pt, LMOM TCB. Will discuss the above w/ pt upon returned call.

## 2011-05-06 NOTE — Telephone Encounter (Signed)
Notified pt this AM of her BNP results, creat stable at 1.13, advised she continue lasix 40 bid until f/u next week. Pt does state her breathing has improved.  Pt c/o "excrutiating pain in right foot/leg," that has been increasing this past week. She thinks right foot is larger than left. She does have h/o PE 6 yrs prior, and is on coumadin. However, notes received from [facility] do show s sub-therapeutic level with rec's for pt to continue dose rather than change according abnormal levels. Pt will f/u here for next coumadin visit.  She has f/u with TG next tues 10/23, she is asking if she can have u/s to r/o dvt. Will discuss with Dr. Mariah Milling today and call pt back.

## 2011-05-10 ENCOUNTER — Encounter: Payer: Self-pay | Admitting: Cardiovascular Disease

## 2011-05-10 ENCOUNTER — Ambulatory Visit (INDEPENDENT_AMBULATORY_CARE_PROVIDER_SITE_OTHER): Payer: Medicare Other | Admitting: Cardiovascular Disease

## 2011-05-10 ENCOUNTER — Ambulatory Visit (INDEPENDENT_AMBULATORY_CARE_PROVIDER_SITE_OTHER): Payer: Medicare Other | Admitting: *Deleted

## 2011-05-10 DIAGNOSIS — R0600 Dyspnea, unspecified: Secondary | ICD-10-CM

## 2011-05-10 DIAGNOSIS — I10 Essential (primary) hypertension: Secondary | ICD-10-CM

## 2011-05-10 DIAGNOSIS — M79604 Pain in right leg: Secondary | ICD-10-CM

## 2011-05-10 DIAGNOSIS — R0989 Other specified symptoms and signs involving the circulatory and respiratory systems: Secondary | ICD-10-CM

## 2011-05-10 DIAGNOSIS — M79609 Pain in unspecified limb: Secondary | ICD-10-CM

## 2011-05-10 DIAGNOSIS — I2510 Atherosclerotic heart disease of native coronary artery without angina pectoris: Secondary | ICD-10-CM

## 2011-05-10 DIAGNOSIS — R609 Edema, unspecified: Secondary | ICD-10-CM

## 2011-05-10 DIAGNOSIS — Z7901 Long term (current) use of anticoagulants: Secondary | ICD-10-CM

## 2011-05-10 DIAGNOSIS — I4891 Unspecified atrial fibrillation: Secondary | ICD-10-CM

## 2011-05-10 LAB — POCT INR: INR: 2.3

## 2011-05-10 NOTE — Assessment & Plan Note (Signed)
Recent symptoms of shortness of breath have resolved. We have suggested she take Lasix p.r.n. For edema or shortness of breath. Previous echocardiogram showed normal LV systolic function, previous catheterization showing no significant coronary artery disease. No further workup at this time.

## 2011-05-10 NOTE — Assessment & Plan Note (Signed)
Blood pressure is well controlled on today's visit. No changes made to the medications. 

## 2011-05-10 NOTE — Assessment & Plan Note (Signed)
We have recommended leg elevation, TED hose or Ace wraps for worsening edema.

## 2011-05-10 NOTE — Assessment & Plan Note (Signed)
Heart rate and rhythm are well controlled on today's visit. We have suggested she continue atenolol b.i.d..

## 2011-05-10 NOTE — Patient Instructions (Signed)
Talk with Dr. Burnett Sheng about a muscle relaxer pill (flexeril) Also about sleep (trazodone) Try neurontin for pain at night   Please call us if you have new issues that need to be addressed before your next appt.  The office will contact you for a follow up Appt. In 12 months

## 2011-05-10 NOTE — Assessment & Plan Note (Signed)
We have suggested she talk with Dr. Burnett Sheng about her pain. She does have Neurontin and we have suggested she try this for sleep. She does continue to have insomnia. Other medications include trazodone. She does have significant muscle cramping in the right leg with contractures. She could possibly try Flexeril, and Ativan or Xanax p.r.n.

## 2011-05-10 NOTE — Assessment & Plan Note (Signed)
Currently with no symptoms of angina. No further workup at this time. Continue current medication regimen. 

## 2011-05-10 NOTE — Progress Notes (Signed)
Patient ID: [name], female    DOB: 09/18/1928, 77 y.o.   MRN: [id number]  HPI Comments: 77 yo WF with history of Spinal cord injury in 2006 as part of a complication from back surgery with chronic right lower extremity pain and weakness, mild non-obstructive CAD, HTN, Hyperlipidemia, paroxysmal atrial fibrillation, PE, asthma and chronic venous insufficiency who presents for follow up.   Her husband passed away in 08/16/2010. She had a recent mechanical fall and wrist fracture requiring surgical repair. She has completed rehabilitation at [facility] and is now back at home.  She had a hospitalization in November 2011 for shortness of breath felt secondary to bronchitis and diastolic dysfunction. Echocardiogram showed normal function with mild MR, diastolic dysfunction, normal right ventricular systolic pressures.  She was recently seen in the office for symptoms of shortness of breath. EKG showed normal sinus rhythm with rate of 65 beats per minute. BNP was 200. She had been started on Lasix with improvement of her symptoms and now she will take this only p.r.n. For symptoms.   She reports having  neuropathy, chronic pain, severe left knee and left hip pain.  She has had chronic back pain for many years and along with lower extremity joint pain, this keeps her inactive. She has had no exertional chest pain.   she cannot wear compression stockings because she cannot get them on.    EKG shows normal sinus rhythm with rate 63 beats per minute, right bundle branch block, left anterior fascicular block    Outpatient Encounter Prescriptions as of 05/10/2011  Medication Sig Dispense Refill  . amLODipine (NORVASC) 5 MG tablet Take 5 mg by mouth daily.        Marland Kitchen atenolol (TENORMIN) 25 MG tablet Take 25 mg by mouth 2 (two) times daily.        . furosemide (LASIX) 40 MG tablet Take 1 tablet (40 mg total) by mouth 2 (two) times daily as needed.  60 tablet  6  . lisinopril (PRINIVIL,ZESTRIL)  10 MG tablet Take 10 mg by mouth daily.        . MECLIZINE HCL PO Take by mouth.        . nitroGLYCERIN (NITROSTAT) 0.4 MG SL tablet Place 1 tablet (0.4 mg total) under the tongue every 5 (five) minutes as needed.  25 tablet  1  . potassium chloride SA (K-DUR,KLOR-CON) 20 MEQ tablet Take 20 mEq by mouth 2 (two) times daily.        . sertraline (ZOLOFT) 50 MG tablet Take 50 mg by mouth daily.        . simvastatin (ZOCOR) 40 MG tablet Take 40 mg by mouth at bedtime.        Marland Kitchen warfarin (COUMADIN) 3 MG tablet 3 mg. Take as directed by the coumadin clinic       . warfarin (COUMADIN) 5 MG tablet Take 5 mg by mouth daily. As directed by anticoagulation clinic         Review of Systems  HENT: Negative.   Eyes: Negative.   Gastrointestinal: Negative.   Musculoskeletal: Positive for back pain, joint swelling, arthralgias and gait problem.  Skin: Negative.   Neurological: Negative.   Hematological: Negative.   Psychiatric/Behavioral: Negative.   All other systems reviewed and are negative.   BP 122/78  Pulse 76  Resp 16  Ht 5\' 5"  (1.651 m)  Wt 175 lb (79.379 kg)  BMI 29.12 kg/m2  Physical Exam  Nursing note  and vitals reviewed. Constitutional: She is oriented to person, place, and time. She appears well-developed and well-nourished.       Deconditioned, walks with a walker, slow gait  HENT:  Head: Normocephalic.  Nose: Nose normal.  Mouth/Throat: Oropharynx is clear and moist.  Eyes: Conjunctivae are normal. Pupils are equal, round, and reactive to light.  Neck: Normal range of motion. Neck supple. No JVD present.  Cardiovascular: Normal rate, regular rhythm, normal heart sounds and intact distal pulses.  Exam reveals no gallop and no friction rub.   No murmur heard. Pulmonary/Chest: Effort normal and breath sounds normal. No respiratory distress. She has no wheezes. She has no rales. She exhibits no tenderness.  Abdominal: Soft. Bowel sounds are normal. She exhibits no distension.  There is no tenderness.  Musculoskeletal: Normal range of motion. She exhibits edema. She exhibits no tenderness.  Lymphadenopathy:    She has no cervical adenopathy.  Neurological: She is alert and oriented to person, place, and time. Coordination normal.  Skin: Skin is warm and dry. No rash noted. No erythema.  Psychiatric: She has a normal mood and affect. Her behavior is normal. Judgment and thought content normal.         Assessment and Plan

## 2011-05-11 ENCOUNTER — Encounter: Payer: Medicare Other | Admitting: *Deleted

## 2011-05-25 ENCOUNTER — Ambulatory Visit (INDEPENDENT_AMBULATORY_CARE_PROVIDER_SITE_OTHER): Payer: Medicare Other | Admitting: Emergency Medicine

## 2011-05-25 DIAGNOSIS — I4891 Unspecified atrial fibrillation: Secondary | ICD-10-CM

## 2011-05-25 DIAGNOSIS — Z7901 Long term (current) use of anticoagulants: Secondary | ICD-10-CM

## 2011-05-25 LAB — POCT INR: INR: 2

## 2011-05-31 ENCOUNTER — Encounter: Payer: Self-pay | Admitting: *Deleted

## 2011-06-15 ENCOUNTER — Encounter: Payer: Medicare Other | Admitting: Emergency Medicine

## 2011-07-27 ENCOUNTER — Ambulatory Visit (INDEPENDENT_AMBULATORY_CARE_PROVIDER_SITE_OTHER): Payer: Medicare Other | Admitting: Emergency Medicine

## 2011-07-27 ENCOUNTER — Encounter: Payer: Medicare Other | Admitting: Emergency Medicine

## 2011-07-27 DIAGNOSIS — I4891 Unspecified atrial fibrillation: Secondary | ICD-10-CM

## 2011-07-27 DIAGNOSIS — Z7901 Long term (current) use of anticoagulants: Secondary | ICD-10-CM

## 2011-08-10 ENCOUNTER — Encounter: Payer: Medicare Other | Admitting: Emergency Medicine

## 2011-08-17 ENCOUNTER — Ambulatory Visit (INDEPENDENT_AMBULATORY_CARE_PROVIDER_SITE_OTHER): Payer: Medicare Other | Admitting: Emergency Medicine

## 2011-08-17 DIAGNOSIS — Z7901 Long term (current) use of anticoagulants: Secondary | ICD-10-CM

## 2011-08-17 DIAGNOSIS — I4891 Unspecified atrial fibrillation: Secondary | ICD-10-CM

## 2011-08-17 LAB — POCT INR: INR: 1.7

## 2011-08-31 ENCOUNTER — Encounter: Payer: Medicare Other | Admitting: Emergency Medicine

## 2011-11-09 ENCOUNTER — Ambulatory Visit: Payer: Self-pay | Admitting: Cardiovascular Disease

## 2011-11-09 DIAGNOSIS — I4891 Unspecified atrial fibrillation: Secondary | ICD-10-CM

## 2011-11-09 DIAGNOSIS — Z7901 Long term (current) use of anticoagulants: Secondary | ICD-10-CM

## 2012-02-21 ENCOUNTER — Other Ambulatory Visit: Payer: Self-pay | Admitting: Dermatology

## 2012-05-09 ENCOUNTER — Encounter (HOSPITAL_BASED_OUTPATIENT_CLINIC_OR_DEPARTMENT_OTHER): Admission: RE | Payer: Self-pay | Source: Ambulatory Visit

## 2012-05-09 ENCOUNTER — Ambulatory Visit (HOSPITAL_BASED_OUTPATIENT_CLINIC_OR_DEPARTMENT_OTHER): Admission: RE | Admit: 2012-05-09 | Payer: Medicare Other | Source: Ambulatory Visit | Admitting: Orthopedic Surgery

## 2012-05-09 SURGERY — AMPUTATION DIGIT
Anesthesia: Choice | Laterality: Right

## 2012-05-23 ENCOUNTER — Other Ambulatory Visit: Payer: Self-pay | Admitting: Dermatology

## 2012-09-24 ENCOUNTER — Encounter: Payer: Self-pay | Admitting: Nurse Practitioner

## 2012-09-24 ENCOUNTER — Encounter: Payer: Self-pay | Admitting: Cardiothoracic Surgery

## 2012-10-16 ENCOUNTER — Encounter: Payer: Self-pay | Admitting: Nurse Practitioner

## 2012-10-16 ENCOUNTER — Encounter: Payer: Self-pay | Admitting: Cardiothoracic Surgery

## 2012-11-15 ENCOUNTER — Encounter: Payer: Self-pay | Admitting: Nurse Practitioner

## 2012-11-15 ENCOUNTER — Encounter: Payer: Self-pay | Admitting: Cardiothoracic Surgery

## 2012-12-02 ENCOUNTER — Emergency Department: Payer: Self-pay | Admitting: Emergency Medicine

## 2012-12-02 LAB — URINALYSIS, COMPLETE
Bilirubin,UR: NEGATIVE
Blood: NEGATIVE
Leukocyte Esterase: NEGATIVE
Nitrite: NEGATIVE
Ph: 8 (ref 4.5–8.0)
Protein: NEGATIVE
WBC UR: <1 /HPF (ref 0–5)

## 2012-12-02 LAB — PROTIME-INR
INR: 1.4
Prothrombin Time: 17.6 secs — ABNORMAL HIGH (ref 11.5–14.7)

## 2012-12-02 LAB — BASIC METABOLIC PANEL
Anion Gap: 7 (ref 7–16)
BUN: 22 mg/dL — ABNORMAL HIGH (ref 7–18)
Calcium, Total: 9.6 mg/dL (ref 8.5–10.1)
Chloride: 104 mmol/L (ref 98–107)
Co2: 27 mmol/L (ref 21–32)
EGFR (African American): >60
EGFR (Non-African Amer.): >60
Glucose: 93 mg/dL (ref 65–99)
Osmolality: 279 (ref 275–301)
Potassium: 4.1 mmol/L (ref 3.5–5.1)

## 2012-12-02 LAB — CBC
HCT: 34.3 % — ABNORMAL LOW (ref 35.0–47.0)
HGB: 11.4 g/dL — ABNORMAL LOW (ref 12.0–16.0)
MCHC: 33.2 g/dL (ref 32.0–36.0)
MCV: 93 fL (ref 80–100)
RBC: 3.71 10*6/uL — ABNORMAL LOW (ref 3.80–5.20)
WBC: 8 10*3/uL (ref 3.6–11.0)

## 2012-12-07 ENCOUNTER — Ambulatory Visit: Payer: Medicare Other | Admitting: Family Medicine

## 2012-12-11 ENCOUNTER — Ambulatory Visit (INDEPENDENT_AMBULATORY_CARE_PROVIDER_SITE_OTHER): Payer: Medicare Other | Admitting: Family Medicine

## 2012-12-11 ENCOUNTER — Encounter: Payer: Self-pay | Admitting: Family Medicine

## 2012-12-11 VITALS — BP 140/70 | HR 80 | Temp 97.8°F

## 2012-12-11 DIAGNOSIS — Z8669 Personal history of other diseases of the nervous system and sense organs: Secondary | ICD-10-CM

## 2012-12-11 DIAGNOSIS — I4891 Unspecified atrial fibrillation: Secondary | ICD-10-CM

## 2012-12-11 DIAGNOSIS — I10 Essential (primary) hypertension: Secondary | ICD-10-CM

## 2012-12-11 DIAGNOSIS — Z87828 Personal history of other (healed) physical injury and trauma: Secondary | ICD-10-CM

## 2012-12-11 DIAGNOSIS — Z9181 History of falling: Secondary | ICD-10-CM

## 2012-12-11 MED ORDER — DULOXETINE HCL 30 MG PO CPEP: 30.0000 mg | ORAL_CAPSULE | Freq: Every day | ORAL | Status: AC

## 2012-12-11 NOTE — Progress Notes (Signed)
Subjective:    Patient ID: [name], female    DOB: 08-20-1928, 79 y.o.   MRN: [id number]  HPI  79 yo very pleasant female new to me here to establish care with complicated PMH.    She is currently a resident in ALF at [facility].  The RN for ALF sends a note with her today stating she was seen at [facility] on 5/18 (we do not have these records) for a fall and has had multiple falls in past month.  They are concerned that she needs daily home care assistance or transfer to SNF which she has been very resistant too. INR was also subtherapeutic at [facility] at 1.4.  On coumadin 3 mg daily for A fib (also had a h/o PE in 2006 s/p back fusion).  She has difficulty getting around- uses walker to go everywhere.  She had an injury to her spine during her 4th back fusion in 2006.  Now has right foot drop and severe end stage OA of left knee.  She states other falls were "silly mis steps," but fall on 5/18 was more serious.  She was trying to take a shower and could not get her leg over the shower lip and slowly slipped until she fell.  She was in quite a bit of pain because she states she was lying in the shower for awhile before she was found.  She does not have a pull cord in the shower.  No dizziness, CP or SOB prior to any of her falls.  Has not seen Dr. Mariah Milling since 04/2011.  Does complain of some worsening DOE and weakness.  She would like to stop taking cymbalta.  States she was starting on this for leg pain and it has not helped.  Denies any anxiety or depression.  Patient Active Problem List   Diagnosis Date Noted  . History of spinal cord injury 12/11/2012  . History of fall within past 90 days 12/11/2012  . Leg pain, right 05/10/2011  . Encounter for long-term (current) use of anticoagulants 10/27/2010  . Dyspnea 10/27/2010  . ESSENTIAL HYPERTENSION, BENIGN 02/02/2009  . Coronary atherosclerosis of native coronary artery 02/02/2009  . ATRIAL FIBRILLATION 02/02/2009  . EDEMA 02/02/2009    Past Medical History  Diagnosis Date  . Coronary artery disease     mild nonobstructive by cath  . Hypertension   . Hyperlipidemia   . Pulmonary embolism   . Paroxysmal atrial fibrillation   . Asthma   . Degenerative disc disease   . Lower extremity edema    Past Surgical History  Procedure Laterality Date  . Back surgery      x 4  . Total hip arthroplasty  1990    left  . Replacement total knee      right  . Nasal sinus surgery     History  Substance Use Topics  . Smoking status: Former Smoker -- 1.00 packs/day for 20 years    Types: Cigarettes    Quit date: 07/18/1974  . Smokeless tobacco: Never Used  . Alcohol Use: No   Family History  Problem Relation Age of Onset  . Alzheimer's disease Mother   . Heart attack Father   . Heart disease Other     strong family history  . Coronary artery disease Other     strong family history  . Wilson's disease Maternal Uncle    Allergies  Allergen Reactions  . Fentanyl   . Levofloxacin   . Methocarbamol   .  Morphine   . Oxycodone Hcl   . Propoxyphene-Acetaminophen   . Sulfonamide Derivatives    Current Outpatient Prescriptions on File Prior to Visit  Medication Sig Dispense Refill  . atenolol (TENORMIN) 25 MG tablet Take 25 mg by mouth 2 (two) times daily.        Marland Kitchen lisinopril (PRINIVIL,ZESTRIL) 10 MG tablet Take 10 mg by mouth daily.        . MECLIZINE HCL PO Take by mouth.        . nitroGLYCERIN (NITROSTAT) 0.4 MG SL tablet Place 1 tablet (0.4 mg total) under the tongue every 5 (five) minutes as needed.  25 tablet  1  . simvastatin (ZOCOR) 40 MG tablet Take 40 mg by mouth at bedtime.        Marland Kitchen warfarin (COUMADIN) 3 MG tablet 3 mg. Take as directed by the coumadin clinic        No current facility-administered medications on file prior to visit.   The PMH, PSH, Social History, Family History, Medications, and allergies have been reviewed in [facility], and have been updated if relevant.     Review of Systems See  HPI Denies any anxiety or depression    Objective:   Physical Exam BP 140/70  Pulse 80  Temp(Src) 97.8 F (36.6 C)  General:  Well-developed,well-nourished,in no acute distress; alert,appropriate and cooperative throughout examination In wheelchair Head:  normocephalic and atraumatic.   Psych:  Cognition and judgment appear intact. Alert and cooperative with normal attention span and concentration. No apparent delusions, illusions, hallucinations        Assessment & Plan:  1. Atrial fibrillation Subtherapeutic.  Recheck INR today. - Protime-INR  2. Essential hypertension, benign Stable.  3. History of spinal cord injury Now with deconditioning- also has wound on right foot that is being followed.  4. History of fall within past 90 days >35 min spent with face to face with patient, >50% counseling and/or coordinating care. Also discussed with [name], RN.  A face to face meeting is being scheduled with [name] and her son to discuss adding daily home care assistance.  I do agree this would be helpful to keep her from having to go to SNF since she is very much opposed moving to SNF.  I have also recommended PT/OT due to deconditioning.  Follow up in 2 months.  I will see her at ALF before that time.

## 2012-12-11 NOTE — Patient Instructions (Addendum)
It was nice to meet you. Please come see me in 2 months.  Please make an appointment to see Dr. Mariah Milling.  Please wean off Cymbalta- 1 tablet every other day for 2 weeks.  If you feel ok then, you can stop it.

## 2012-12-13 ENCOUNTER — Telehealth: Payer: Self-pay

## 2012-12-13 NOTE — Telephone Encounter (Signed)
I have an appointment to talk to [facility] by phone at 12:30 tomorrow.  I honestly cannot comment on this too much because she just established care with me.  I will ask [name] to discuss this with her previous PCP, Dr. Doreene Eland who knows her better as well.

## 2012-12-13 NOTE — Telephone Encounter (Signed)
Pt said [name] CEO of [facility] came by pt's apt today and wanted pt to move to the health care division of [facility] [place] today. Pt refused. Ms [name] said move necessary because pt unsafe and cannot receive level of care in present apt. Pt wants Dr Elmer Sow opinion if pt can remain in present apt at [facility]. [name] is checking with [name] about contacting Dr Dayton Martes because pt wants answer ASAP.Please advise.

## 2012-12-14 ENCOUNTER — Telehealth: Payer: Self-pay

## 2012-12-14 NOTE — Telephone Encounter (Signed)
Pt left v/m; pt said PT wants OT to come into pts appt at [facility] to assess pts shower and safe way for pt to get in and out of shower. Pt said Dr Dayton Martes needs to place order for OT.

## 2012-12-17 NOTE — Telephone Encounter (Signed)
I think this order has been taking care of.  Jacki Cones, please verify this with [name], RN.

## 2012-12-17 NOTE — Telephone Encounter (Signed)
Spoke with [name], this has been taken care of, advised patient.

## 2012-12-18 ENCOUNTER — Telehealth: Payer: Self-pay

## 2012-12-18 NOTE — Telephone Encounter (Signed)
Pt left v/m; pt had missed called on her phone on 12/17/12 and thinks it may be re: lab results. Pt request call back.

## 2012-12-20 LAB — PROTIME-INR: INR: 2.3 — AB (ref 0.9–1.1)

## 2012-12-20 NOTE — Telephone Encounter (Signed)
Left message on voice mail advising patient that we didn't call her on that day.

## 2012-12-21 ENCOUNTER — Encounter: Payer: Self-pay | Admitting: Family Medicine

## 2013-01-04 ENCOUNTER — Encounter: Payer: Self-pay | Admitting: Family Medicine

## 2013-01-08 ENCOUNTER — Telehealth: Payer: Self-pay | Admitting: Internal Medicine

## 2013-01-08 MED ORDER — FUROSEMIDE 20 MG PO TABS: 20.0000 mg | ORAL_TABLET | Freq: Every day | ORAL | Status: AC

## 2013-01-08 MED ORDER — POTASSIUM CHLORIDE CRYS ER 10 MEQ PO TBCR: 10.0000 meq | EXTENDED_RELEASE_TABLET | Freq: Two times a day (BID) | ORAL | Status: AC

## 2013-01-08 NOTE — Telephone Encounter (Signed)
email from [place] RN at assisted living Having more swelling Not dyspneic Had been on lasix but not lately  Reviewed also with Dr Dayton Martes Will restart lasix and potassium She will follow up with her next week

## 2013-01-17 ENCOUNTER — Encounter: Payer: Self-pay | Admitting: Family Medicine

## 2013-01-23 ENCOUNTER — Non-Acute Institutional Stay: Payer: Medicare Other | Admitting: Family Medicine

## 2013-01-23 VITALS — BP 158/80 | HR 80 | Resp 18 | Wt 201.0 lb

## 2013-01-23 DIAGNOSIS — M79609 Pain in unspecified limb: Secondary | ICD-10-CM

## 2013-01-23 DIAGNOSIS — Z87828 Personal history of other (healed) physical injury and trauma: Secondary | ICD-10-CM

## 2013-01-23 DIAGNOSIS — M79604 Pain in right leg: Secondary | ICD-10-CM

## 2013-01-23 DIAGNOSIS — Z8669 Personal history of other diseases of the nervous system and sense organs: Secondary | ICD-10-CM

## 2013-01-23 DIAGNOSIS — I4891 Unspecified atrial fibrillation: Secondary | ICD-10-CM

## 2013-01-23 DIAGNOSIS — Z9181 History of falling: Secondary | ICD-10-CM

## 2013-01-23 DIAGNOSIS — R609 Edema, unspecified: Secondary | ICD-10-CM

## 2013-01-23 NOTE — Progress Notes (Signed)
Subjective:    Patient ID: [name], female    DOB: 1929-02-27, 79 y.o.   MRN: [id number]  HPI  [name] is an 79 yo very pleasant female with complicated history who recently establish care with me and recently moved to ALF, who is seen by me and Syliva Overman, RN today.  She is currently a resident in ALF at [facility] at has been having a steady decline.  Due to pain from her multiple falls (5 in the last month) and progressive OA (followed by Dr. Berton Lan), she has been refusing PT/OT and refusing to come down for meals.  Also refusing help from aids.  All falls have occurred with ADLs- showering, changing, etc. She has been in constant conversation with administration about needing more care in home- at least hours per day or going to health care facility which she is very resistant to.  She has difficulty getting around- uses walker to go everywhere, now also has a wheelchair from Dr. Berton Lan.  She had an injury to her spine during her 4th back fusion in 2006.  Now has right foot drop and severe end stage OA of left knee.  ound.  She does not have a pull cord in the shower.  No dizziness, CP or SOB prior to any of her falls.  Has not seen Dr. Mariah Milling since 04/2011.  Does complain of some worsening DOE and weakness and LE edema.  Lasix 20 mg daily with kdur was restarted last week but she has refused to take it since it can cause dizziness.  She has been trying to keep legs elevated.  [name] has noted even more weakness this week.  Unclear if she has gained weight since I saw her in my office in 11/2012 because she refused to weight at that visit.  Wt stable at [facility] (201 lb last month).  Patient Active Problem List   Diagnosis Date Noted  . History of spinal cord injury 12/11/2012  . History of fall within past 90 days 12/11/2012  . Leg pain, right 05/10/2011  . Encounter for long-term (current) use of anticoagulants 10/27/2010  . Dyspnea 10/27/2010  . ESSENTIAL HYPERTENSION,  BENIGN 02/02/2009  . Coronary atherosclerosis of native coronary artery 02/02/2009  . ATRIAL FIBRILLATION 02/02/2009  . EDEMA 02/02/2009   Past Medical History  Diagnosis Date  . Coronary artery disease     mild nonobstructive by cath  . Hypertension   . Hyperlipidemia   . Pulmonary embolism   . Paroxysmal atrial fibrillation   . Asthma   . Degenerative disc disease   . Lower extremity edema    Past Surgical History  Procedure Laterality Date  . Back surgery      x 4  . Total hip arthroplasty  1990    left  . Replacement total knee      right  . Nasal sinus surgery     History  Substance Use Topics  . Smoking status: Former Smoker -- 1.00 packs/day for 20 years    Types: Cigarettes    Quit date: 07/18/1974  . Smokeless tobacco: Never Used  . Alcohol Use: No   Family History  Problem Relation Age of Onset  . Alzheimer's disease Mother   . Heart attack Father   . Heart disease Other     strong family history  . Coronary artery disease Other     strong family history  . Wilson's disease Maternal Uncle    Allergies  Allergen  Reactions  . Fentanyl   . Levofloxacin   . Methocarbamol   . Morphine   . Oxycodone Hcl   . Propoxyphene-Acetaminophen   . Sulfonamide Derivatives    Current Outpatient Prescriptions on File Prior to Visit  Medication Sig Dispense Refill  . atenolol (TENORMIN) 25 MG tablet Take 25 mg by mouth 2 (two) times daily.        Marland Kitchen BIOTIN PO Take 3000 mcg's by mouth twice a day.      . cyclobenzaprine (FLEXERIL) 10 MG tablet Take 10 mg by mouth 3 (three) times daily as needed for muscle spasms.      . diazepam (VALIUM) 5 MG tablet Take 5 mg by mouth at bedtime as needed for anxiety.      . DULoxetine (CYMBALTA) 30 MG capsule Take 1 capsule (30 mg total) by mouth daily.    3  . ergocalciferol (VITAMIN D2) 50000 UNITS capsule Take 50,000 Units by mouth once a week. On Wednesday.      . furosemide (LASIX) 20 MG tablet Take 1 tablet (20 mg total) by  mouth daily.  30 tablet  3  . hyoscyamine (LEVSIN SL) 0.125 MG SL tablet Place 0.125 mg under the tongue every 4 (four) hours as needed for cramping.      Marland Kitchen lisinopril (PRINIVIL,ZESTRIL) 10 MG tablet Take 10 mg by mouth daily.        . MECLIZINE HCL PO Take by mouth.        . nitroGLYCERIN (NITROSTAT) 0.4 MG SL tablet Place 1 tablet (0.4 mg total) under the tongue every 5 (five) minutes as needed.  25 tablet  1  . potassium chloride (K-DUR,KLOR-CON) 10 MEQ tablet Take 1 tablet (10 mEq total) by mouth 2 (two) times daily.  30 tablet  3  . simvastatin (ZOCOR) 40 MG tablet Take 40 mg by mouth at bedtime.        Marland Kitchen warfarin (COUMADIN) 3 MG tablet 3 mg. Take as directed by the coumadin clinic        No current facility-administered medications on file prior to visit.   The PMH, PSH, Social History, Family History, Medications, and allergies have been reviewed in [facility], and have been updated if relevant.     Review of Systems See HPI Denies any anxiety or depression    Objective:   Physical Exam BP 158/80  Pulse 80  Resp 18  Wt 201 lb (91.173 kg)  BMI 33.45 kg/m2  General:  Well-developed,well-nourished,in no acute distress; alert,appropriate and cooperative throughout examination In wheelchair Head:  normocephalic and atraumatic.   Psych:  Cognition and judgment appear intact. Alert and cooperative with normal attention span and concentration. No apparent delusions, illusions, hallucinations        Assessment & Plan:  1. Atrial fibrillation  Rate controlled on coumadin.  2. Essential hypertension, benign Stable.  3. History of spinal cord injury Now with deconditioning- also has wound on right foot that is being followed.  4. History of fall within past 90 days >55 min spent with face to face with patient and Mandy, >50% counseling and/or coordinating care. Due to her decline, I would recommend increase assistance in home.  She is very resistant.  Does not want to go to  healthcare.  Several of these concerns really need to be addressed by administration, not myself or Mandy.  I will make that recommendation to Sela Hua and Delice Bison.

## 2013-01-25 ENCOUNTER — Encounter: Payer: Self-pay | Admitting: Family Medicine

## 2013-02-01 ENCOUNTER — Emergency Department: Payer: Self-pay | Admitting: Emergency Medicine

## 2013-02-01 LAB — COMPREHENSIVE METABOLIC PANEL
Bilirubin,Total: 0.4 mg/dL (ref 0.2–1.0)
Chloride: 105 mmol/L (ref 98–107)
Co2: 34 mmol/L — ABNORMAL HIGH (ref 21–32)
EGFR (African American): 50 — ABNORMAL LOW
EGFR (Non-African Amer.): 43 — ABNORMAL LOW
Glucose: 82 mg/dL (ref 65–99)
Osmolality: 285 (ref 275–301)
Potassium: 4.6 mmol/L (ref 3.5–5.1)
Total Protein: 6.9 g/dL (ref 6.4–8.2)

## 2013-02-01 LAB — CBC WITH DIFFERENTIAL/PLATELET
Basophil %: 1 %
Eosinophil #: 0.3 10*3/uL (ref 0.0–0.7)
HCT: 34.7 % — ABNORMAL LOW (ref 35.0–47.0)
Monocyte %: 10.2 %
Neutrophil %: 67.9 %
Platelet: 253 10*3/uL (ref 150–440)
WBC: 6.4 10*3/uL (ref 3.6–11.0)

## 2013-02-01 LAB — PROTIME-INR: Prothrombin Time: 23.4 secs — ABNORMAL HIGH (ref 11.5–14.7)

## 2013-02-11 ENCOUNTER — Ambulatory Visit: Payer: Medicare Other | Admitting: Family Medicine

## 2013-02-11 DIAGNOSIS — Z0289 Encounter for other administrative examinations: Secondary | ICD-10-CM

## 2013-06-06 ENCOUNTER — Other Ambulatory Visit (HOSPITAL_COMMUNITY): Payer: Self-pay | Admitting: Neurological Surgery

## 2013-06-06 DIAGNOSIS — M5000 Cervical disc disorder with myelopathy, unspecified cervical region: Secondary | ICD-10-CM

## 2013-06-17 ENCOUNTER — Ambulatory Visit (HOSPITAL_COMMUNITY)
Admission: RE | Admit: 2013-06-17 | Discharge: 2013-06-17 | Disposition: A | Discharge status: Home / Self Care | Payer: Medicare Other | Source: Ambulatory Visit | Attending: Neurological Surgery | Admitting: Neurological Surgery

## 2013-06-17 DIAGNOSIS — M5000 Cervical disc disorder with myelopathy, unspecified cervical region: Secondary | ICD-10-CM

## 2013-06-17 DIAGNOSIS — M4712 Other spondylosis with myelopathy, cervical region: Secondary | ICD-10-CM | POA: Insufficient documentation

## 2013-06-26 ENCOUNTER — Inpatient Hospital Stay: Payer: Self-pay | Admitting: Internal Medicine

## 2013-06-26 LAB — URINALYSIS, COMPLETE
Glucose,UR: NEGATIVE mg/dL (ref 0–75)
Ketone: NEGATIVE
Leukocyte Esterase: NEGATIVE
Nitrite: NEGATIVE
Protein: NEGATIVE
RBC,UR: 1 /HPF (ref 0–5)
Squamous Epithelial: 1
WBC UR: 3 /HPF (ref 0–5)

## 2013-06-26 LAB — COMPREHENSIVE METABOLIC PANEL WITH GFR
Albumin: 3 g/dL — ABNORMAL LOW
Alkaline Phosphatase: 82 U/L
Anion Gap: 4 — ABNORMAL LOW
BUN: 26 mg/dL — ABNORMAL HIGH
Bilirubin,Total: 0.6 mg/dL
Calcium, Total: 10 mg/dL
Chloride: 101 mmol/L
Co2: 34 mmol/L — ABNORMAL HIGH
Creatinine: 0.9 mg/dL
EGFR (African American): >60
EGFR (Non-African Amer.): 60 — ABNORMAL LOW
Glucose: 98 mg/dL
Osmolality: 282
Potassium: 4.3 mmol/L
SGOT(AST): 20 U/L
SGPT (ALT): 21 U/L
Sodium: 139 mmol/L
Total Protein: 6.9 g/dL

## 2013-06-26 LAB — CBC
HCT: 40.4 %
HGB: 13.3 g/dL
MCH: 30.8 pg
MCHC: 32.8 g/dL
MCV: 94 fL
Platelet: 230 x10 3/mm 3
RBC: 4.31 X10 6/mm 3
RDW: 14.9 % — ABNORMAL HIGH
WBC: 5.9 x10 3/mm 3

## 2013-06-26 LAB — CK TOTAL AND CKMB (NOT AT ARMC): CK-MB: 2 ng/mL (ref 0.5–3.6)

## 2013-06-26 LAB — PROTIME-INR: Prothrombin Time: 25.3 secs — ABNORMAL HIGH (ref 11.5–14.7)

## 2013-06-26 LAB — PRO B NATRIURETIC PEPTIDE: B-Type Natriuretic Peptide: 694 pg/mL — ABNORMAL HIGH

## 2013-06-26 LAB — TROPONIN I: Troponin-I: <0.02 ng/mL

## 2013-06-27 ENCOUNTER — Ambulatory Visit: Payer: Self-pay | Admitting: Internal Medicine

## 2013-06-27 LAB — CBC WITH DIFFERENTIAL/PLATELET
Basophil %: 1 %
HCT: 39.5 % (ref 35.0–47.0)
HGB: 13.1 g/dL (ref 12.0–16.0)
Lymphocyte #: 1 10*3/uL (ref 1.0–3.6)
Lymphocyte %: 18.9 %
MCH: 31 pg (ref 26.0–34.0)
MCHC: 33.1 g/dL (ref 32.0–36.0)
MCV: 94 fL (ref 80–100)
Monocyte #: 0.6 x10 3/mm (ref 0.2–0.9)
Monocyte %: 11.9 %
Neutrophil %: 62.9 %
Platelet: 213 10*3/uL (ref 150–440)
RBC: 4.22 10*6/uL (ref 3.80–5.20)
RDW: 14.7 % — ABNORMAL HIGH (ref 11.5–14.5)
WBC: 5.3 10*3/uL (ref 3.6–11.0)

## 2013-06-27 LAB — BASIC METABOLIC PANEL
Anion Gap: 6 — ABNORMAL LOW (ref 7–16)
Chloride: 100 mmol/L (ref 98–107)
Co2: 32 mmol/L (ref 21–32)
EGFR (African American): >60
Glucose: 110 mg/dL — ABNORMAL HIGH (ref 65–99)
Osmolality: 281 (ref 275–301)

## 2013-06-27 LAB — MAGNESIUM: Magnesium: 1.5 mg/dL — ABNORMAL LOW

## 2013-06-28 LAB — CBC WITH DIFFERENTIAL/PLATELET
Basophil #: 0 10*3/uL (ref 0.0–0.1)
Eosinophil #: 0 10*3/uL (ref 0.0–0.7)
HGB: 12.8 g/dL (ref 12.0–16.0)
Lymphocyte #: 0.4 10*3/uL — ABNORMAL LOW (ref 1.0–3.6)
MCH: 30.4 pg (ref 26.0–34.0)
MCHC: 33 g/dL (ref 32.0–36.0)
MCV: 92 fL (ref 80–100)
Monocyte #: 0.1 x10 3/mm — ABNORMAL LOW (ref 0.2–0.9)
Monocyte %: 1.4 %
Neutrophil #: 5.3 10*3/uL (ref 1.4–6.5)
Platelet: 240 10*3/uL (ref 150–440)
RBC: 4.21 10*6/uL (ref 3.80–5.20)
RDW: 14.9 % — ABNORMAL HIGH (ref 11.5–14.5)

## 2013-06-28 LAB — BASIC METABOLIC PANEL
Creatinine: 0.86 mg/dL (ref 0.60–1.30)
EGFR (Non-African Amer.): >60
Glucose: 174 mg/dL — ABNORMAL HIGH (ref 65–99)
Osmolality: 280 (ref 275–301)
Potassium: 4.3 mmol/L (ref 3.5–5.1)

## 2013-06-28 LAB — MAGNESIUM: Magnesium: 1.7 mg/dL — ABNORMAL LOW

## 2013-06-28 LAB — PROTIME-INR: Prothrombin Time: 31.7 secs — ABNORMAL HIGH (ref 11.5–14.7)

## 2013-06-29 LAB — PROTIME-INR
INR: 4
Prothrombin Time: 37.4 secs — ABNORMAL HIGH (ref 11.5–14.7)

## 2013-06-30 LAB — VANCOMYCIN, TROUGH: Vancomycin, Trough: 20 ug/mL (ref 10–20)

## 2013-06-30 LAB — PROTIME-INR: INR: 3.5

## 2013-07-01 LAB — PROTIME-INR
INR: 3.2
Prothrombin Time: 31.5 secs — ABNORMAL HIGH (ref 11.5–14.7)

## 2013-07-01 LAB — CULTURE, BLOOD (SINGLE)

## 2013-07-02 LAB — CREATININE, SERUM
Creatinine: 0.95 mg/dL (ref 0.60–1.30)
EGFR (African American): >60

## 2013-07-02 LAB — PROTIME-INR
INR: 2.5
Prothrombin Time: 26.3 secs — ABNORMAL HIGH (ref 11.5–14.7)

## 2013-07-18 ENCOUNTER — Ambulatory Visit: Payer: Self-pay | Admitting: Internal Medicine

## 2013-07-18 DEATH — deceased

## 2014-08-22 ENCOUNTER — Encounter: Payer: Self-pay | Admitting: Internal Medicine

## 2014-11-07 NOTE — Consult Note (Signed)
Brief Consult Note: Diagnosis: Right knee pain; history of TKA.   Patient was seen by consultant.   Comments: Patient presents with history of pain in the right knee.  She had a total knee replacement done over ten years ago in [place], per her report.  She does not recall name of surgeon or exact timing of surgery.  She states that she had been doing quite well intially but seems to have worsening pain as her back degeneration progresses.  She has required 4 spinal fusions at different levels and feels that she loses ground with her knee each time.  She has a more acute worsening of pain in the knee when she got her leg stuck in a sara lift sling.    PE:  Right knee is minimally warmer than opposite knee. No erythema.  Mild effusion. Patella very mobile without pain.  Mildly TTP about entire medial and lateral joint line.  Some laxity on both varus and valgus stress. ROM 5-120 degrees with no pain on terminal flexion;l some discomforrt on terminal extension.  Calf is soft and non-tender. Difficulty with dorsiflexion of foot (patient denies foot drop but described significant difficulty with motion of the feet due to prior spine surgery).  Imaging: Radiographs of right knee show TKA with no evidence of fracture of dislocation.  No obvious evidence of loosening; possible poly wear.  Assessment: pain in right knee after injury; history of TKA.    Plan: The patient states that she is worried that the TKA may be loosening as she has had progressive pain over the past many years.  She will need outpatient follow up for this.  She [place]  She has previously seen Dr. Rosita KeaMenz for her wrist and steroid injections on the oppostie knee.  She is apparently scheduled to see neursurgery for her neck next week.   Please provide her with contact information for Dr. Rosita KeaMenz or [facility] for follow up after discharge from hospital at her convenience..  Electronic Signatures: Murlean Harkamasunder,  Gurman Ashland (MD)  (Signed 12-Dec-14 11:02)  Authored: Brief Consult Note   Last Updated: 12-Dec-14 11:02 by Murlean Harkamasunder, Taeden Geller (MD)

## 2014-11-07 NOTE — Consult Note (Signed)
   Comments   Follow up visit made. Pt did get some relief with morphine though now having leg cramps. Discussed at length with son and son-in-law. Will give valium x 1 for leg cramps. Will start morphine continuous drip and also order prn morphine. Will add prn benadryl for itching.   Electronic Signatures: Alvar Malinoski, Harriett SineNancy (MD)  (Signed 15-Dec-14 17:23)  Authored: Palliative Care   Last Updated: 15-Dec-14 17:23 by Lemon Whitacre, Harriett SineNancy (MD)

## 2014-11-07 NOTE — Consult Note (Signed)
   Comments   Family asked to speak with me. They feel that pt is suffering and want to transition her to comfort care. I discussed with son, daughter and son-in-law and all are in agreement. Orders entered. Morphine drip increased to 2mg /hr. [facility] if bed available.   Electronic Signatures: Abie Killian, Harriett SineNancy (MD)  (Signed 16-Dec-14 15:23)  Authored: Palliative Care   Last Updated: 16-Dec-14 15:23 by Deosha Werden, Harriett SineNancy (MD)

## 2014-11-07 NOTE — Consult Note (Signed)
Referring Physician:  Gladstone Lighter :   Primary Care Physician:  [facility] [place], [facility], [address] [place], [place] [address], [phone]  Reason for Consult: Admit Date: 27-Jun-2013  Chief Complaint: weakness  Reason for Consult: weakness   History of Present Illness: History of Present Illness:   81 yo RHD F presents to [facility] secondary to progressive arm and leg weakness.  Pt has long hx of leg weakness that started in 2006 with back surgery then pt got to the point that she was able to walk with a walker until Aug 2013 when she had a fall.  After that, she had progressive leg weakness and had multiple surgeries with rehab.  Initially she went to assisted living but was slowing declining so they then placed her at a nursing home.  Over the past two weeks, pt has been complaining about neck pain and worsening ability to use her arms and legs.  She can no longer feed herself.  She was also unable to be transferred from the bed like previous due to this weakness.  Pt reports chronic numbness and tingling but no big increase lately.  She reports problems swallowing and breathing over the past few days.  ROS:  General weakness   HEENT no complaints   Lungs no complaints   Cardiac no complaints   GI no complaints   GU no complaints   Musculoskeletal back pain   Extremities no complaints   Skin no complaints   Neuro numbness/tingling   Endocrine no complaints   Psych depression   Past Medical/Surgical Hx:  Cellulitis:   Anemia:   Atrial Fibrillation:   Asthma:   Pruritic Disorder:   CHF:   hypertension:   Pulmonary Embolus:   Knee Surgery - Right:   Hip Replacement - Right:   Hysterectomy:   ORIF left wrist, carpel tunnel repair:   spinal fusion: x 4  Past Medical/ Surgical Hx:  Past Medical History as above   Past Surgical History as above   Home Medications: Medication Instructions Last  Modified Date/Time  Requip 0.25 mg oral tablet 2 tab(s) orally once a day (at bedtime) (8 pm) 10-Dec-14 12:10  Senna S 50 mg-8.6 mg oral tablet 1-2 tab(s) orally once a day (at bedtime), As Needed - for Constipation 10-Dec-14 12:10  Valium 5 mg oral tablet 1 tab(s) orally once a day (at bedtime), As Needed for sleep 10-Dec-14 12:10  hyoscyamine 0.125 mg oral tablet 1 tab(s) orally 4 times a day, As Needed for abdominal pain 10-Dec-14 12:10  MiraLax - oral powder for reconstitution 17 gram(s)  mixed in 8 ounces of water or jucie orally every other day (laxative) (8 pm) 10-Dec-14 12:10  Vitamin D2 50,000 intl units oral capsule 1 cap(s) orally once a week on Wednesday (8 am) 10-Dec-14 12:10  Mobic 15 mg oral tablet 1 tab(s) orally once a day (8 am) 10-Dec-14 12:10  biotin 1000 mcg oral tablet 3 tab(s) orally once a day (8 am) 10-Dec-14 12:10  lisinopril 10 mg oral tablet 1 tab(s) orally once a day (hypertension) (8 am) 10-Dec-14 12:10  Symbicort 160 mcg-4.5 mcg/inh inhalation aerosol 2 puff(s) inhaled 2 times a day (8 am, 8 pm) 10-Dec-14 12:10  Nitrostat 0.4 mg sublingual tablet 1 tab(s) sublingual every 5 minutes, As Needed - for Chest Pain 10-Dec-14 12:10  Tylenol 325 mg oral tablet 2 tab(s) (650 mg) orally every 4 hours, As Needed - for Pain/fever 10-Dec-14 12:10  sodium chloride nasal 0.65% nasal spray 1 spray(s) nasal 3 times a day (8 am, 2 pm, 8 pm) 10-Dec-14 12:10  Valium 5 mg oral tablet 1 tab(s) orally 2 times a day (8 am, 8 pm) 10-Dec-14 12:10  Tums 1 tab(s) orally 2 times a day (8 am, 8 pm) 10-Dec-14 12:10  baclofen 10 mg oral tablet 1 tab(s) orally 4 times a day (2 am, 8 am, 2 pm, 8 pm) 10-Dec-14 12:10  atenolol 25 mg oral tablet 1 tab(s) orally 2 times a day (hypertension/heart) (8 am, 8 pm) 10-Dec-14 12:10  Coumadin 3 mg oral tablet 1 tab(s) orally once a day (in the evening) (5 pm) 10-Dec-14 12:10  meclizine 25 mg oral tablet 1 tab(s) orally once a day, As Needed for dizziness  10-Dec-14 12:10   Allergies:  Oxycontin: SOB, Hives  Vicodin: Hives  Levaquin: Rash  Dilaudid: Rash  Ultram: Rash  Zinacef: GI Distress  Sulfa drugs: Hives  Codeine: Hives  Shellfish: Hives  Chocolate: Rash  Morphine: Other  Fentanyl: None  Percocet: Unknown  Erythromycin: Unknown  Contrast dye: Unknown  Zantac: Unknown  Cardizem: Unknown  Eggs: Unknown  Animal dander: Unknown  pt allergic to mold/mildew: Unknown  pt has seasonal allergies: Unknown  Allergies:  Allergies see above   Social/Family History: Employment Status: disabled  Lives With: alone  Living Arrangements: extended care facility  Social History: no tob, no EtOH, no illicits  Family History: n/c   Vital Signs: **Vital Signs.:   11-Dec-14 05:00  Vital Signs Type Routine  Temperature Temperature (F) 97.4  Celsius 36.3  Temperature Source oral  Pulse Pulse 48  Respirations Respirations 18  Systolic BP Systolic BP 007  Diastolic BP (mmHg) Diastolic BP (mmHg) 75  Mean BP 88  Pulse Ox % Pulse Ox % 99  Pulse Ox Activity Level  At rest  Oxygen Delivery Room Air/ 21 %    11:50  Telemetry pattern Cardiac Rhythm Bradycardia   Physical Exam: General: ill-appearing in moderate distress, overweight  HEENT: normocephalic, sclera nonicteric, oropharynx clear  Neck: limited ROM due to pain, -Brudinski, no JVD  Chest: CTA B, no wheezing, good movement  Cardiac: RRR, no murmurs, no edema, 2+ pulses  Extremities: limited ROM due to pain, no C/C   Neurologic Exam: Mental Status: alert and oriented x 3, normal language, mild dysarthria  Cranial Nerves: PERRLA, EOMI, nl VF, face symmetric, tongue midline  Motor Exam: 1/5 proximal in B UE, 0/5 proximal in B LE, 2-3/5 distal B UE, 2/5 distal B LE, mild atrophy of hands, unchanged tone in UE, increased in B LE, no tremor, no fasciculations noted  Deep Tendon Reflexes: 3+/4 B UE with slightly more brisk on L, + Hoffmann B, 1/4 B LE, mute plantars  Sensory Exam:  there is a sensory level 2 inches above the nipple line  Coordination: untestable   Lab Results: Hepatic:  10-Dec-14 12:56   Bilirubin, Total 0.6  Alkaline Phosphatase 82 (45-117 NOTE: New Reference Range 06/07/13)  SGPT (ALT) 21  SGOT (AST) 20  Total Protein, Serum 6.9  Albumin, Serum  3.0  Routine Micro:  10-Dec-14 15:55   Micro Text Report BLOOD CULTURE   COMMENT                   NO GROWTH IN 8-12 HOURS   ANTIBIOTIC                       Culture Comment NO GROWTH  IN 8-12 HOURS  Result(s) reported on 27 Jun 2013 at 12:00AM.  Routine Chem:  10-Dec-14 12:56   B-Type Natriuretic Peptide ([facility])  694 (Result(s) reported on 26 Jun 2013 at 01:30PM.)  11-Dec-14 05:10   Result Comment ESR - STABILITY LIMIT EXCEEDED  - TO BE RECOLLECTED KBH/QSD  Result(s) reported on 27 Jun 2013 at 09:50AM.  Magnesium, Serum  1.5 (1.8-2.4 THERAPEUTIC RANGE: 4-7 mg/dL TOXIC: > 10 mg/dL  -----------------------)  Glucose, Serum  110  BUN  27  Creatinine (comp) 0.88  Sodium, Serum 138  Potassium, Serum 4.0  Chloride, Serum 100  CO2, Serum 32  Calcium (Total), Serum  10.2  Anion Gap  6  Osmolality (calc) 281  eGFR (African American) >60  eGFR (Non-African American) >60 (eGFR values <64m/min/1.73 m2 may be an indication of chronic kidney disease (CKD). Calculated eGFR is useful in patients with stable renal function. The eGFR calculation will not be reliable in acutely ill patients when serum creatinine is changing rapidly. It is not useful in  patients on dialysis. The eGFR calculation may not be applicable to patients at the low and high extremes of body sizes, pregnant women, and vegetarians.)  Cardiac:  10-Dec-14 12:56   CK, Total 35  CPK-MB, Serum 2.0 (Result(s) reported on 26 Jun 2013 at 01:30PM.)  Troponin I < 0.02 (0.00-0.05 0.05 ng/mL or less: NEGATIVE  Repeat testing in 3-6 hrs  if clinically indicated. >0.05 ng/mL: POTENTIAL  MYOCARDIAL INJURY. Repeat  testing in 3-6 hrs  if  clinically indicated. NOTE: An increase or decrease  of 30% or more on serial  testing suggests a  clinically important change)  Routine UA:  10-Dec-14 15:42   Color (UA) Yellow  Clarity (UA) Clear  Glucose (UA) Negative  Bilirubin (UA) Negative  Ketones (UA) Negative  Specific Gravity (UA) 1.010  Blood (UA) Negative  pH (UA) 6.0  Protein (UA) Negative  Nitrite (UA) Negative  Leukocyte Esterase (UA) Negative (Result(s) reported on 26 Jun 2013 at 03:59PM.)  RBC (UA) 1 /HPF  WBC (UA) 3 /HPF  Bacteria (UA) NONE SEEN  Epithelial Cells (UA) 1 /HPF (Result(s) reported on 26 Jun 2013 at 03:59PM.)  Routine Coag:  10-Dec-14 12:56   Prothrombin  25.3  INR 2.4 (INR reference interval applies to patients on anticoagulant therapy. A single INR therapeutic range for coumarins is not optimal for all indications; however, the suggested range for most indications is 2.0 - 3.0. Exceptions to the INR Reference Range may include: Prosthetic heart valves, acute myocardial infarction, prevention of myocardial infarction, and combinations of aspirin and anticoagulant. The need for a higher or lower target INR must be assessed individually. Reference: The Pharmacology and Management of the Vitamin K  antagonists: the seventh ACCP Conference on Antithrombotic and Thrombolytic Therapy. CDJSHF.0263Sept:126 (3suppl): [id number] A HCT value >55% may artifactually increase the PT.  In one study,  the increase was an average of 25%. Reference:  "Effect on Routine and Special Coagulation Testing Values of Citrate Anticoagulant Adjustment in Patients with High HCT Values." American Journal of Clinical Pathology 2006;126:400-405.)  Routine Hem:  11-Dec-14 05:10   WBC (CBC) 5.3  RBC (CBC) 4.22  Hemoglobin (CBC) 13.1  Hematocrit (CBC) 39.5  Platelet Count (CBC) 213  MCV 94  MCH 31.0  MCHC 33.1  RDW  14.7  Neutrophil % 62.9  Lymphocyte % 18.9  Monocyte % 11.9  Eosinophil % 5.3  Basophil % 1.0   Neutrophil # 3.3  Lymphocyte # 1.0  Monocyte #  0.6  Eosinophil # 0.3  Basophil # 0.1 (Result(s) reported on 27 Jun 2013 at 05:53AM.)    10:30   Erythrocyte Sed Rate 26 (Result(s) reported on 27 Jun 2013 at 11:35AM.)   Impression/Recommendations: Recommendations:   prior notes reviewed by me reviewed by me   Probable cervical stenosis-  this is very symptomatic as pt has lost ability to eat and perform other ADL's.  There is significant strenght loss in this pattern and if there is a surgical lesion as suspected would recommend immediate treatment.  Other etiologies include demyelinating condition, nutrition deficit, motor neuron disease and myasthenia gravis Depression-  this is severe and making treatment harder needs neurosurgical evaluation start dexamethasone 52m q6h start Neurotin 3029mBID for pain try to obtain outside MRI if possible needs a repeat MRI avoid PT/OT for now needs cervical collar will follow unless pt is transferred  Electronic Signatures: SmJamison NeighborMD)  (Signed 11-Dec-14 13:43)  Authored: REFERRING PHYSICIAN, Primary Care Physician, Consult, History of Present Illness, Review of Systems, PAST MEDICAL/SURGICAL HISTORY, HOME MEDICATIONS, ALLERGIES, Social/Family History, NURSING VITAL SIGNS, Physical Exam-, LAB RESULTS, Recommendations   Last Updated: 11-Dec-14 13:43 by SmJamison NeighborMD)

## 2014-11-08 NOTE — Discharge Summary (Signed)
PATIENT NAME:  [name], [name] MR#:  [id number] DATE OF BIRTH:  [birth year]  DATE OF ADMISSION:  06/26/2013 DATE OF DISCHARGE:  07/05/2013  ADMITTING PHYSICIAN: Enid Baasadhika Treyce Spillers, MD  PRIMARY CARE PHYSICIAN: Dr. Burnett ShengHedrick.  CONSULTATIONS IN HOSPITAL:  1.  Neurology consultation by Dr. Mellody DrownMatthew Smith.  2.  Palliative care consultation by Dr. Harriett SineNancy Phifer.  3.  Orthopedic consultation with Dr. Claris Gladdenamasunder.   DISCHARGE DIAGNOSES: 1.  Progressive quadriparesis.  2.  Severe cervical stenosis, which is nonoperable.  3.  History of lumbar spinal stenosis.  4.  Hypertension.  5.  Hyperlipidemia.  6.  Chronic paroxysmal atrial fibrillation.  7.  Chronic angina.  8.  History of PE on Coumadin.  9.   Gastroesophageal reflux disease.  10.  Chronic obstructive pulmonary disease.  11.  Degenerative disk disease and status post multiple lower back surgeries.  12.  Neurogenic bladder.  13.  Diastolic congestive heart failure.  14.  Pneumonia.   LABS AND IMAGING: Prior to discharge, CT showing no acute intracranial abnormality. Chest x-ray on admission showing stable left lower lobe atelectasis versus pneumonia. WBC on admission was 5.8, hemoglobin 12.8, hematocrit 38.9, platelet count 240. Sodium 136, potassium 4.3, chloride 100, bicarbonate 33, BUN 24, creatinine 0.86, glucose 174.   BRIEF HOSPITAL COURSE: The patient is an 81 year old Caucasian female with progressively worsening cervical stenosis and also lumbar spine stenosis has been bedbound secondary to chronic lower extremity weakness, now progressing to the upper extremities, especially decreased strength on the left side. She was seen by her neurosurgeon, Dr. Danielle DessElsner from [facility], and had an outpatient MRI of the C-spine a week ago, prior to this admission. However, she was deemed nonsurgical due to clinical deterioration, by her neurosurgeon. The patient was sent in from [facility] secondary to difficulty breathing and also  progressive upper arm weakness.   Quadriparesis with weakness in the lower extremities and also progressing to the left upper extremity secondary to worsening cervical stenosis, nonoperable this time, according to the neurosurgeon. The patient was taking chronic pain medications as an outpatient, requiring higher amounts of pain medications in the hospital because of her clinical deterioration. Palliative care was consulted on admission. The patient also seemed extremely depressed. After neurology saw the patient, they thought it was secondary to severe cervical spinal stenosis and cervical myelopathy. They had nothing else to offer, so they signed off and recommended neurosurgeon follow up. Palliative care after discussing with the family and also the patient. Finally, family decided that the patient be comfort care. The patient has been or morphine drip and pain medications have been adjusted to help her with the pain, and she is being discharged to hospice home on July 05, 2013.   CODE STATUS: DO NOT RESUSCITATE.   TIME SPENT ON DISCHARGE: 35 minutes.     ____________________________ Enid Baasadhika Mckinsey Keagle, MD rk:cg D: 07/05/2013 13:52:44 ET T: 07/05/2013 22:51:21 ET JOB#: [id number]  cc: Enid Baasadhika Harol Shabazz, MD, <Dictator> Rhona LeavensJames F. Burnett ShengHedrick, MD Enid BaasADHIKA Titiana Severa MD ELECTRONICALLY SIGNED 07/24/2013 7:21

## 2014-11-08 NOTE — H&P (Signed)
PATIENT NAME:  [name], [name] MR#:  [id number] DATE OF BIRTH:  28-Aug-1928  DATE OF ADMISSION:  06/26/2013  ADMITTING PHYSICIAN: [name], M.D.   PRIMARY CARE PHYSICIAN: Dr. Kary [name].  NEUROSURGEON: Dr. Ellene [name] from [facility].   CHIEF COMPLAINT: Difficulty breathing and progressive upper arm weakness.   HISTORY OF PRESENT ILLNESS: [name] is an 81 year old Caucasian female with multiple medical problems including multiple back surgeries which resulted in bilateral lower extremity weakness, who is bedbound at baseline from [facility] skilled nursing facility, worsening bilateral upper extremity weakness going on for more than 3 months now. She was seen by neurosurgeon last in August 2014 based on the records and at the time, he felt that her lower extremity weakness was progressively getting worse and he did not have anything else to offer from a neurosurgical standpoint. She also demonstrated some left upper extremity weakness following a frozen shoulder and recommended MRI of the C-spine as an outpatient.   The patient had a CT of the C-spine done at [facility] a couple of weeks ago. No results are available at this time, but according to her, since her MRI/C-spine at [facility], her arm weakness has been progressively getting worse. For several weeks now, she was needing assistance with feeding and right now, she has only 2/5 strength of her right upper extremity and 1/5 strength of her left upper extremity and 1/5 strength of both her lower extremities.   She also appears extremely clinically dry on exam and noted to have pneumonia in the left lower lobe. The patient is not a great historian. Most of the history is obtained from ER physician and also old notes. The patient denies any chest pain. No cough, no fevers but complains of some dyspnea going on for a few days now.  PAST MEDICAL HISTORY:  1.  Hypertension.  2.  Hyperlipidemia.  3.  Chronic paroxysmal atrial  fibrillation.  4.  Chronic angina.  5.  History of PE on Coumadin.  6.  Gastroesophageal reflux disease.  7.  Spinal stenosis.  8.  Chronic obstructive pulmonary disease.  9.  Progressive bilateral lower extremity weakness, worse on the right side, secondary to degenerative disk disease in the back status post multiple back surgeries.  10.  Neurogenic bladder as a complication of spinal surgery.  11.  Diastolic congestive heart failure.   PAST SURGICAL HISTORY:  1.  L1-L4 spinal fusions, x 4, done at [facility].  2.  Hysterectomy.  3.  Sinus surgery.  4.  Total hip replacement on the right side.  5.  Right knee surgery.  6.  Sinus surgery.  7.  Left Achilles tendon repair.  8.  Colon surgery for stricture in 1995.   ALLERGIES TO MEDICATIONS: SHE DOES HAVE MULTIPLE ALLERGIES INCLUDING LEVAQUIN, ULTRAM, DILAUDID, SULFA, ZINACEF, ZANTAC, PERCOCET, CODEINE, CARDIZEM, ERYTHROMYCIN, SHELLFISH, MOLD, MILDEW, CATS, DOGS, RAGWEED, CERTAIN GRASSES, DURAGESIC PATCH, CONTRAST DYE, OXYCONTIN.   CURRENT MEDICATIONS:  1.  Atenolol 25 mg p.o. b.i.d.  2.  Baclofen 10 mg p.o. 4 times a day.  3.  Biotin 1000 mcg 3 tablets once a day. 4.  Coumadin 3 mg p.o. daily.  5.  Hyoscyamine 0.125 mg p.o. 4 times a day as needed for abdominal pain.  6.  Lisinopril 10 mg p.o. daily.  7.  Meclizine 25 mg p.o. daily.  8.  MiraLax powder for constipation p.r.n.  9.  Mobic 15 mg p.o. daily.  10.  Sublingual nitroglycerin p.r.n. for chest  pain.  11.  Requip 0.25 mg 2 tablets at bedtime.  12.  Senna Colace 1 tablet as needed for constipation.  13.  Nasal spray sodium chloride 3 times a day.  14.  Symbicort 160/4.5 mcg 2 puffs b.i.d.  15.  TUMS 1 tablet p.o. b.i.d.  16.  Tylenol 650 mg every 4 hours as needed.  17.  Valium 5 mg p.o. b.i.d. at 8:00 a.m. and 8:00 p.m.  18.  Valium 5 mg p.o. at bedtime as needed for sleep.  19.  Vitamin D2 at 50,000 international units once a week on Wednesdays.   SOCIAL  HISTORY: The patient is widowed. She has 2 children. She has been a resident at [facility] facility over the past few months. Prior to that, the patient was living at [facility] independent living facility. She is electric wheelchair-bound at baseline.   FAMILY HISTORY: Per the records, significant for heart disease.   REVIEW OF SYSTEMS:  CONSTITUTIONAL: No fever. Positive for fatigue and weakness.  EYES: No blurred vision, double vision. The patient has ptosis noted of both eyes. No glaucoma or cataracts.  ENT: No tinnitus, ear pain. Positive for hearing loss. Uses hearing aids. No epistaxis or discharge.  RESPIRATORY: No cough, wheeze, hemoptysis or COPD. Positive for dyspnea on exertion.  CARDIOVASCULAR: Denies any chest pain, orthopnea, edema. Positive for dyspnea on exertion. No palpitations or syncope.  GASTROINTESTINAL: No nausea, vomiting. Positive for constipation. No diarrhea. Positive for lower abdominal pain. No rectal bleed or melena.  GENITOURINARY: Positive for incontinence and increased frequency of urination.  ENDOCRINE: No polyuria, nocturia, thyroid problems, heat or cold intolerance.  HEMATOLOGY: No anemia, easy bruising or bleeding.  SKIN: No acne, rash, or lesions.  MUSCULOSKELETAL: Positive for low back pain and arthritis. No gout.  NEUROLOGIC:  No history of CVA or TIA. Positive for paresthesias, numbness and weakness in the lower extremities and upper extremities.  PSYCHIATRIC: No anxiety, insomnia or depression.   PHYSICAL EXAMINATION:  VITAL SIGNS: Temperature 98.1.F pulse 62,  respirations 22, blood pressure 132/59, pulse ox 92% on room air.  GENERAL: Well-built, well-nourished female lying in bed, not in any acute distress.  HEENT: Ptosis noted of both eyelids, worse on the left side. Pupils are equal, round, reacting to light. Anicteric sclerae. Extraocular movements intact.  OROPHARYNX: Very dry mucous membranes but otherwise clear without  erythema, mass or exudates.  NECK: Supple. No thyromegaly, JVD or carotid bruits. No lymphadenopathy.  LUNGS: Moving air bilaterally. No wheeze or crackles. No use of accessory muscles for breathing. Decreased left basilar breath sounds with some rhonchi.  CARDIOVASCULAR: S1, S2, regular rate and rhythm. No murmurs, rubs or gallops.  ABDOMEN: Soft, nontender. Mild discomfort in the lower abdominal region. No tenderness. No rigidity or guarding. Normal bowel sounds.  EXTREMITIES: Show 1+ trace edema noted in bilateral lower extremities. No clubbing or cyanosis.  SKIN: No acne, rash or lesions.  LYMPHATICS: No cervical or lymphadenopathy.  NEUROLOGICAL: Cranial nerves II through XII remain intact. Motor strength of the right upper extremity is 2 to 3 over 5, left upper extremity is 1/5, and bilateral lower extremities 1/5 Sensation seems to be intact.  PSYCHOLOGIC: Awake, alert, oriented x 3.   LABORATORY DATA: WBC 5.9, hemoglobin 13.3, hematocrit 40.4, platelet count 230.   Sodium 139, potassium 4.3, chloride 101, bicarb 34, BUN 26, creatinine 0.90, glucose 98, calcium 10.0.   ALT 21, AST 20, alk phos 32, total bili 0.6 and albumin 3.0. Troponin less  than 0.02. BNP 694. INR is 2.4. CK 36, CK-MB 2.0.   Chest x-ray showing left lower lobe consolidation with adjacent pleural effusion. This could represent pneumonia.   EKG showing sinus rhythm with PACs and left anterior fascicular block and right bundle branch block noted.   ASSESSMENT AND PLAN: An 81 year old female with multiple medical problems from [facility] skilled nursing facility, bedbound at baseline, sent for progressive weakness of all of extremities and noted increased work of breathing.  1.  Left lower lobe pneumonia with pleural effusion. Will treat as HCAP. Blood cultures ordered and is started on IV vancomycin, Zosyn and azithromycin with gentamicin due to [place]. Monitor and oxygen support as needed.  2.  Progressive  bilateral upper extremity weakness with significant history of degenerative disk disease status post multiple lower back surgeries, following with neurosurgery in [place]. Upper extremity weakness for 2 weeks now, getting worse. Get MRI of the brain and C-spine, neuro checks, neurology consult. Also will order myasthenia panel.  3.  Chronic lower extremity pain, paresthesias from the lower back surgery. Continue her home medications.  4.  Hypertension. Continue home medications.  5.  History of pulmonary embolism. Continue Coumadin. Her INR is therapeutic.  6.  Chronic obstructive pulmonary disease/asthma, stable. Continue inhalers.  7.  Gastrointestinal prophylaxis.   CODE STATUS: FULL CODE.   We will get palliative care consult for discussion of goals.   TIME SPENT ON ADMISSION: 50 minutes.    ____________________________ [name] Lighter, MD rk:np D: 06/26/2013 15:51:13 ET T: 06/26/2013 16:59:41 ET JOB#: [id number]  cc: [name] Lighter, MD, <Dictator> Irven Easterly. [name] Kos, MD Earleen Newport, MD  [name] Lighter MD ELECTRONICALLY SIGNED 07/30/2013 14:48

## 2014-11-08 NOTE — H&P (Signed)
PATIENT NAME:  DANDRA, SHAMBAUGH MR#:  [id number] DATE OF BIRTH:  May 02, 1929  DATE OF ADMISSION:  06/26/2013  ADMITTING PHYSICIAN: Gladstone Lighter, M.D.   PRIMARY CARE PHYSICIAN: Dr. Kary Kos.   CHIEF COMPLAINT:  Progressive arm weakness and difficulty breathing.   HISTORY OF PRESENT ILLNESS: Ms. Galiano is an 81 year old Caucasian female with multiple medical problems in the past, including multiple back surgeries done for degenerative disk disease with resultant bilateral lower extremity weakness, history of COPD, spinal stenosis, history of PE on Coumadin, paroxysmal atrial fibrillation, hypertension, who is a resident at [facility] facility for the past several months, presents to the hospital secondary to the above-mentioned complaints. The patient seems very depressed. She was at [facility] independent living facility, was moved over to the skilled nursing facility because of increased level of care over the last several months. She has had severe back problems, had 4 surgeries done on her lower back and was following with Dr. Ellene Route, neurosurgeon in [place]. According to his note from August 2014, it seems like the patient's lower extremity weakness was getting worse, and he was thinking this was going to be reversible, and there was nothing else that he was going to do.  She was using a rolling walker in the past, and now she is pretty much bedbound with 1/5 strength in both lower extremities. When she saw Dr. Ellene Route in August 2014, she was also having some weakness of the left arm; however, she had a recent frozen shoulder too, but over the last 2 to 3 weeks, her arm strength has been extremely decreased. She had an outpatient MRI done at [facility] a couple of weeks ago. According to the ER physician, when they tried to get the records, it was a CT of the C-spine and not the MRI done. However, since the last 2 weeks, her weakness is getting progressively worse. The patient was not  feeling better, and she was also having trouble breathing over the last couple of days with increased cough, so was sent over to the ER. She was noted to have a left lower lobe pneumonia here, and also her bilateral arm weakness was noted.   PAST MEDICAL HISTORY: 1.  Hypertension.  2.  Hyperlipidemia.  3.  Paroxysmal atrial fibrillation.  4.  Chronic angina.  5.  History of pulmonary embolism on Coumadin.  6.  Gastroesophageal reflux disease.  7.  Spinal stenosis.  8.  Osteoarthritis.  9.  Chronic obstructive pulmonary disease.  10.  Bilateral lower extremity weakness and paresthesias post spinal surgery.  11.  Neurogenic bladder as a  complication of her spine surgery.  The patient is currently bedbound at baseline.   PAST SURGICAL HISTORY:  1.  Four lumbar fusion surgeries.  2.  Right total hip replacement.  3.  Right knee replacement.  4.  Left Achilles tendon repair.  5.  Hysterectomy. 6.  Sinus surgery.  7.  Colon surgery.  ALLERGIES TO MEDICATIONS: The patient is allergic to several medications including LEVAQUIN, ULTRAM, DILAUDID, SULFA, ZINACEF, ZANTAC, PERCOCET, CODEINE, CARDIZEM, ERYTHROMYCIN, SHELLFISH, MOLD, MILDEW, CATS, DOGS, RAGWEED, 13 GLASSES, DURAGESIC PATCH, CONTRAST DYE AND OXYCONTIN.   CURRENT HOME MEDICATIONS:  1.  Atenolol 25 mg p.o. b.i.d.  2.  Baclofen 10 mg 4 times a day.  3.  Biotene 1000 mcg 3 tablets once a day.  4.  Coumadin 3 mg p.o. daily.  5.  Hyoscyamine 0.125 mg 4 times a day as needed for abdominal pain.  6.  Lisinopril 10 mg p.o. daily.  7.  Meclizine 25 mg p.o. daily p.r.n.  8.  MiraLAX powder p.r.n. for constipation.  9.  Mobic 15 mg daily.  10.  Nitrostat 0.4 mg sublingual p.r.n. for chest pain.  11.  Requip 0.25 mg 2 tablets at bedtime.  12.  Senokot 1 to 2 tablets at bedtime as needed for constipation.  13.  Sodium chloride nasal spray 3 times a day.  14.  Symbicort 160/4.5, 2 puffs twice a day.  15.  TUMS 1 tablet p.o. b.i.d.  16.   Tylenol 650 mg q.4 hours p.r.n.  17.  Valium 5 mg p.o. b.i.d.  18.  Valium 5 mg at bedtime as needed.  19.  Vitamin D2, 50,000 International Units q. weekly.   SOCIAL HISTORY: Has been a resident at [facility] Has 2 daughters. No history of any smoking or alcohol use. Currently bedbound and needs assistance with all activities.   FAMILY HISTORY: Significant for heart disease, according to old records.    REVIEW OF SYSTEMS:   CONSTITUTIONAL: No fever. Positive for fatigue and weakness.  EYES: Positive for blurry vision. No double vision or glaucoma. No inflammation. She uses reading glasses.  ENT: Positive for hearing loss. Wearing hearing aids in both ears. No epistaxis or discharge.  RESPIRATORY: Positive for cough, dyspnea on exertion. No hemoptysis. Possible history of COPD.  CARDIOVASCULAR: No chest pain. No orthopnea, edema, arrhythmia, palpitations or syncope.  GASTROINTESTINAL: Positive for nausea. No vomiting. Positive for diarrhea. No abdominal pain, hematemesis or melena.  GENITOURINARY: No dysuria, hematuria or renal calculus. Positive for neurogenic bladder and incontinence.  ENDOCRINE: No polyuria, nocturia, thyroid problems, heat or cold intolerance.  HEMATOLOGY: No anemia, easy bruising or bleeding.  SKIN: No acne, rash or lesions.  MUSCULOSKELETAL: Positive for low back pain and also arthritis.  NEUROLOGIC: Has paresthesias, numbness and weakness both lower extremities, and also now in the upper extremities, left is worse than the right. No history of CVA or TIA.  PSYCHOLOGIC: No anxiety, insomnia, depression.   PHYSICAL EXAMINATION: VITAL SIGNS: Temperature 97.3 degrees Fahrenheit, pulse 62, respirations 18, blood pressure 121/78, pulse ox was 94% on room air.  GENERAL: Well-built, well-nourished female, appearing chronically ill, lying in bed, not in any acute distress.  HEENT: Normocephalic, atraumatic. Pupils equal, round, reacting to light. Anicteric  sclerae. Extraocular movements intact. Oropharynx clear without erythema, mass or exudates. Dry mucous membranes.  NECK: Supple.  No thyromegaly, JVD or carotid bruits. No lymphadenopathy.  LUNGS: Moving air bilaterally. Decreased breath sounds both bases with fine rales on the left base. No use of accessory muscles for breathing.  CARDIOVASCULAR: S1, S2 regular rate and rhythm. No murmurs, rubs or gallops.  ABDOMEN: Soft, nontender, nondistended. No hepatosplenomegaly. Normal bowel sounds.  EXTREMITIES: Trace pedal edema. No clubbing or cyanosis, 2+ dorsalis pedis pulses palpable bilaterally.  SKIN: No acne, rash or lesions.  NEUROLOGICAL: Cranial nerves seem to be intact. Sensation is intact over both lower extremities. Strength is 1/5 both lower extremities, and left upper extremity is also 1/5. The right upper extremity is about 2 to 3/5.  PSYCHOLOGICAL: The patient is awake, alert, oriented x 3.   LAB DATA: Urinalysis negative for any infection. WBC 5.9, hemoglobin 13.3, hematocrit 40.4, platelet count 230.   Sodium 130, potassium 4.6, chloride 101, bicarb 34, BUN 26, creatinine 0.90, glucose 98 and calcium of 10.0.   ALT 21, AST 20, alk phos 82, total bili 0.6 and albumin of  3.0. Troponin less than 0.02. BNP is 694. INR is therapeutic at 2.4. Chest x-ray showing left lower lobe consolidation with adjacent pleural effusion.   ASSESSMENT AND PLAN: This is an 81 year old female with history of multiple medical problems from [facility] skilled nursing facility, bedbound at baseline, sent for progressive upper extremity weakness and also increased work of breathing.  1.  Left lower lobe pneumonia with pleural effusion, treat as HCAP as from nursing home. Blood cultures have been done and triple antibiotics have been ordered. Monitor oxygen saturations.  2.  Progressive bilateral upper extremity weakness with history of significant degenerative disk disease and multiple lower back surgeries.  Follows with neurosurgeon in [place]. Upper extremity weakness for 2 weeks now getting worse. MRI of brain and C-spine have been ordered. Neurology consult and neuro checks.  3.  Chronic lower extremity pain and paresthesias from her lower back surgery. Continue her home pain medications.  4.  Hypertension. Continue medications from home.  5.  History of pulmonary embolism, on Coumadin, therapeutic INR. Continue Coumadin.  6.  Chronic obstructive pulmonary disease. Continue her inhalers, as she appears to be stable at this time.  7.  Gastrointestinal and deep vein thrombosis prophylaxis.   CODE STATUS: FULL CODE.   TIME SPENT ON ADMISSION: 50 minutes.     ____________________________ Gladstone Lighter, MD rk:dmm D: 06/26/2013 20:31:00 ET T: 06/26/2013 21:06:21 ET JOB#: [id number]  cc: Irven Easterly. Kary Kos, MD Gladstone Lighter, MD, <Dictator> Gladstone Lighter MD ELECTRONICALLY SIGNED 07/30/2013 14:49

## 2015-02-05 ENCOUNTER — Encounter: Payer: Self-pay | Admitting: Internal Medicine

## 2018-11-27 ENCOUNTER — Telehealth: Payer: Self-pay | Admitting: Family Medicine

## 2018-11-27 NOTE — Telephone Encounter (Signed)
Called and could not leave vm. Calling to verify PCP. Patient has not been seen since 2014
